# Patient Record
Sex: Female | Born: 1970 | Hispanic: Yes | Marital: Married | Smoking: Former smoker
Health system: Southern US, Community
[De-identification: ages and names within clinical notes are randomized; demographics above are authoritative.]

## PROBLEM LIST (undated history)

## (undated) DIAGNOSIS — F419 Anxiety disorder, unspecified: Secondary | ICD-10-CM

## (undated) DIAGNOSIS — E785 Hyperlipidemia, unspecified: Secondary | ICD-10-CM

## (undated) DIAGNOSIS — T7840XA Allergy, unspecified, initial encounter: Secondary | ICD-10-CM

## (undated) HISTORY — DX: Allergy, unspecified, initial encounter: T78.40XA

## (undated) HISTORY — PX: OVARIAN CYST SURGERY: SHX726

## (undated) HISTORY — DX: Anxiety disorder, unspecified: F41.9

## (undated) HISTORY — DX: Hyperlipidemia, unspecified: E78.5

---

## 2012-09-16 ENCOUNTER — Encounter (HOSPITAL_BASED_OUTPATIENT_CLINIC_OR_DEPARTMENT_OTHER): Payer: Self-pay | Admitting: *Deleted

## 2012-09-16 ENCOUNTER — Emergency Department (HOSPITAL_BASED_OUTPATIENT_CLINIC_OR_DEPARTMENT_OTHER)
Admission: EM | Admit: 2012-09-16 | Discharge: 2012-09-16 | Disposition: A | Discharge status: Home / Self Care | Payer: BC Managed Care – PPO | Attending: Emergency Medicine | Admitting: Emergency Medicine

## 2012-09-16 ENCOUNTER — Emergency Department (HOSPITAL_BASED_OUTPATIENT_CLINIC_OR_DEPARTMENT_OTHER): Payer: BC Managed Care – PPO

## 2012-09-16 DIAGNOSIS — Z791 Long term (current) use of non-steroidal anti-inflammatories (NSAID): Secondary | ICD-10-CM | POA: Insufficient documentation

## 2012-09-16 DIAGNOSIS — R079 Chest pain, unspecified: Secondary | ICD-10-CM | POA: Insufficient documentation

## 2012-09-16 DIAGNOSIS — R091 Pleurisy: Secondary | ICD-10-CM

## 2012-09-16 DIAGNOSIS — Z79899 Other long term (current) drug therapy: Secondary | ICD-10-CM | POA: Insufficient documentation

## 2012-09-16 DIAGNOSIS — R059 Cough, unspecified: Secondary | ICD-10-CM | POA: Insufficient documentation

## 2012-09-16 DIAGNOSIS — Z87891 Personal history of nicotine dependence: Secondary | ICD-10-CM | POA: Insufficient documentation

## 2012-09-16 DIAGNOSIS — R05 Cough: Secondary | ICD-10-CM | POA: Insufficient documentation

## 2012-09-16 MED ORDER — ONDANSETRON HCL 4 MG PO TABS: 4.0000 mg | ORAL_TABLET | Freq: Four times a day (QID) | ORAL | Status: DC

## 2012-09-16 MED ORDER — IBUPROFEN 800 MG PO TABS
ORAL_TABLET | ORAL | Status: AC
  Filled 2012-09-16: qty 1

## 2012-09-16 MED ORDER — IBUPROFEN 800 MG PO TABS: 800.0000 mg | ORAL_TABLET | Freq: Once | ORAL | Status: DC

## 2012-09-16 MED ORDER — HYDROCODONE-ACETAMINOPHEN 5-325 MG PO TABS
ORAL_TABLET | ORAL | Status: AC
  Filled 2012-09-16: qty 1

## 2012-09-16 MED ORDER — IBUPROFEN 800 MG PO TABS: 800.0000 mg | ORAL_TABLET | Freq: Three times a day (TID) | ORAL | Status: DC

## 2012-09-16 MED ORDER — IBUPROFEN 800 MG PO TABS
800.0000 mg | ORAL_TABLET | Freq: Once | ORAL | Status: AC
  Administered 2012-09-16: 800 mg via ORAL

## 2012-09-16 MED ORDER — HYDROCODONE-ACETAMINOPHEN 5-325 MG PO TABS: 1.0000 | ORAL_TABLET | ORAL | Status: DC | PRN

## 2012-09-16 MED ORDER — ONDANSETRON 4 MG PO TBDP
4.0000 mg | ORAL_TABLET | Freq: Once | ORAL | Status: AC
  Administered 2012-09-16: 4 mg via ORAL

## 2012-09-16 MED ORDER — ONDANSETRON 4 MG PO TBDP
ORAL_TABLET | ORAL | Status: AC
  Filled 2012-09-16: qty 1

## 2012-09-16 MED ORDER — HYDROCODONE-ACETAMINOPHEN 5-325 MG PO TABS
1.0000 | ORAL_TABLET | Freq: Once | ORAL | Status: AC
  Administered 2012-09-16: 1 via ORAL

## 2012-09-16 MED ORDER — HYDROCODONE-ACETAMINOPHEN 5-325 MG PO TABS: 1.0000 | ORAL_TABLET | Freq: Once | ORAL | Status: DC

## 2012-09-16 MED ORDER — ONDANSETRON 4 MG PO TBDP: 4.0000 mg | ORAL_TABLET | Freq: Once | ORAL | Status: DC

## 2012-09-16 NOTE — ED Notes (Signed)
Pain under left scapula x 2 days worse with deep breath coughing non productive had cold prior to the pain developing

## 2012-09-16 NOTE — ED Provider Notes (Signed)
CSN: [id number]     Arrival date & time 09/16/12  1478 History   First MD Initiated Contact with Patient 09/16/12 [phone]     Chief Complaint  Patient presents with  . pain under left scapula     HPI   Tara Alvarado is here with her husband. They live in the family has had what sounds like a viral type syndrome with cough runny nose. She had these symptoms until yesterday. She noticed 2 days ago that she had some sharp pain in the left posterior chest just adjacent her scapula. It hurts to take a deep breath and hurts to cough. She has a dry spontaneous but nonproductive nonbloody cough. She has not felt lightheaded or dizzy. No extremity pain or swelling. She is not pregnant. No prolonged immobilization cast splints fractures surgeries procedures or other DVT or PE risk. Does not take hormones. Family history negative for DVT or PE. No rash or vesicles across her chest History reviewed. No pertinent past medical history. History reviewed. No pertinent past surgical history. History reviewed. No pertinent family history. History  Substance Use Topics  . Smoking status: Former Games developer  . Smokeless tobacco: Not on file  . Alcohol Use: No   OB History   Grav Para Term Preterm Abortions TAB SAB Ect Mult Living                 Review of Systems  Constitutional: Negative for fever, chills, diaphoresis, appetite change and fatigue.  HENT: Negative for sore throat, mouth sores and trouble swallowing.   Eyes: Negative for visual disturbance.  Respiratory: Negative for cough, chest tightness, shortness of breath and wheezing.   Cardiovascular: Positive for chest pain.  Gastrointestinal: Negative for nausea, vomiting, abdominal pain, diarrhea and abdominal distention.  Endocrine: Negative for polydipsia, polyphagia and polyuria.  Genitourinary: Negative for dysuria, frequency and hematuria.  Musculoskeletal: Negative for gait problem.  Skin: Negative for color change, pallor and rash.  Neurological:  Negative for dizziness, syncope, light-headedness and headaches.  Hematological: Does not bruise/bleed easily.  Psychiatric/Behavioral: Negative for behavioral problems and confusion.    Allergies  Review of patient's allergies indicates no known allergies.  Home Medications   Current Outpatient Rx  Name  Route  Sig  Dispense  Refill  . HYDROcodone-acetaminophen (NORCO/VICODIN) 5-325 MG per tablet   Oral   Take 1 tablet by mouth every 4 (four) hours as needed for pain.   10 tablet   0   . ibuprofen (ADVIL,MOTRIN) 800 MG tablet   Oral   Take 1 tablet (800 mg total) by mouth 3 (three) times daily.   21 tablet   0   . ondansetron (ZOFRAN) 4 MG tablet   Oral   Take 1 tablet (4 mg total) by mouth every 6 (six) hours.   12 tablet   0    BP 137/92  Pulse 90  Temp(Src) 97.9 F (36.6 C) (Oral)  SpO2 100%  LMP 08/31/2012 Physical Exam  Pulmonary/Chest:  Normal appearing chest wall without vesicles. No skin sensitivity. No pleural or pericardial friction rubs. Her pain is no different in the upright, forward leaning, or left lateral decubitus position. She has no rubs in either of these physicians.    ED Course  Procedures (including critical care time) Labs Review Labs Reviewed - No data to display Imaging Review No results found.  MDM   1. Pleurisy    Is not hypoxemic, she's not tachycardic. She has no risks for DVT or PE. I  will obtain a plain chest x-ray to rule out pneumothorax and pneumonia. Working diagnosis is likely pleurisy secondary to recent viral infection.  Chest x-ray is normal. No pneumothorax. No infiltrates. Pain is improving. Slight nausea from the ibuprofen and Vicodin. Given by mouth Zofran. Plan rest. Avoid strenuous activity would cause her to breathe hard. Recheck with shortness of breath fevers or other symptoms. She is not tachycardic. She's not hypoxemic. No study is indicated other than the chest x-ray as above. Primary care  followup.    Claudean Kinds, MD 09/16/12 [phone]

## 2012-10-27 ENCOUNTER — Encounter (HOSPITAL_COMMUNITY): Payer: Self-pay | Admitting: Emergency Medicine

## 2012-10-27 ENCOUNTER — Emergency Department (HOSPITAL_COMMUNITY)
Admission: EM | Admit: 2012-10-27 | Discharge: 2012-10-28 | Disposition: A | Discharge status: Home / Self Care | Payer: BC Managed Care – PPO | Attending: Emergency Medicine | Admitting: Emergency Medicine

## 2012-10-27 ENCOUNTER — Emergency Department (HOSPITAL_COMMUNITY): Payer: BC Managed Care – PPO

## 2012-10-27 DIAGNOSIS — S99922A Unspecified injury of left foot, initial encounter: Secondary | ICD-10-CM

## 2012-10-27 DIAGNOSIS — Z79899 Other long term (current) drug therapy: Secondary | ICD-10-CM | POA: Insufficient documentation

## 2012-10-27 DIAGNOSIS — S8990XA Unspecified injury of unspecified lower leg, initial encounter: Secondary | ICD-10-CM | POA: Insufficient documentation

## 2012-10-27 DIAGNOSIS — Y92009 Unspecified place in unspecified non-institutional (private) residence as the place of occurrence of the external cause: Secondary | ICD-10-CM | POA: Insufficient documentation

## 2012-10-27 DIAGNOSIS — IMO0002 Reserved for concepts with insufficient information to code with codable children: Secondary | ICD-10-CM | POA: Insufficient documentation

## 2012-10-27 DIAGNOSIS — Y9301 Activity, walking, marching and hiking: Secondary | ICD-10-CM | POA: Insufficient documentation

## 2012-10-27 DIAGNOSIS — Z87891 Personal history of nicotine dependence: Secondary | ICD-10-CM | POA: Insufficient documentation

## 2012-10-27 MED ORDER — HYDROCODONE-ACETAMINOPHEN 5-325 MG PO TABS
1.0000 | ORAL_TABLET | Freq: Once | ORAL | Status: AC
  Administered 2012-10-27: 1 via ORAL
  Filled 2012-10-27: qty 1

## 2012-10-27 MED ORDER — HYDROCODONE-ACETAMINOPHEN 5-325 MG PO TABS: 1.0000 | ORAL_TABLET | ORAL | Status: DC | PRN

## 2012-10-27 NOTE — ED Provider Notes (Signed)
CSN: [id number]     Arrival date & time 10/27/12  2253 History   First MD Initiated Contact with Patient 10/27/12 2314     Chief Complaint  Patient presents with  . Foot Injury   (Consider location/radiation/quality/duration/timing/severity/associated sxs/prior Treatment) HPI Comments: Patient here after running into a bench while walking barefooted at home and striking her left 5th toe.  She reports pain and increased swelling, no prior injury to the area.  Is able to ambulate without difficulty.  Patient is a 42 y.o. female presenting with foot injury. The history is provided by the patient. No language interpreter was used.  Foot Injury Location:  Toe Time since incident:  2 hours Injury: yes   Toe location:  L little toe Pain details:    Quality:  Shooting and sharp   Radiates to:  Does not radiate   Severity:  Moderate   Onset quality:  Sudden   Timing:  Constant   Progression:  Worsening Chronicity:  New Dislocation: no   Foreign body present:  No foreign bodies Prior injury to area:  No Relieved by:  Nothing Worsened by:  Nothing tried Ineffective treatments:  None tried Associated symptoms: swelling   Associated symptoms: no back pain, no fatigue, no fever, no itching, no muscle weakness, no neck pain, no numbness, no stiffness and no tingling     History reviewed. No pertinent past medical history. Past Surgical History  Procedure Laterality Date  . Cesarean section     Family History  Problem Relation Age of Onset  . Cancer Other   . CAD Other    History  Substance Use Topics  . Smoking status: Former Games developer  . Smokeless tobacco: Not on file  . Alcohol Use: No   OB History   Grav Para Term Preterm Abortions TAB SAB Ect Mult Living                 Review of Systems  Constitutional: Negative for fever and fatigue.  Musculoskeletal: Negative for back pain, neck pain and stiffness.  Skin: Negative for itching.  All other systems reviewed and are  negative.    Allergies  Review of patient's allergies indicates no known allergies.  Home Medications   Current Outpatient Rx  Name  Route  Sig  Dispense  Refill  . naproxen sodium (ANAPROX) 275 MG tablet   Oral   Take 275 mg by mouth 2 (two) times daily with a meal.          BP 147/87  Pulse 91  Temp(Src) 98.8 F (37.1 C) (Oral)  Resp 14  SpO2 100%  LMP 10/01/2012 Physical Exam  Nursing note and vitals reviewed. Constitutional: She is oriented to person, place, and time. She appears well-developed and well-nourished. No distress.  HENT:  Head: Normocephalic and atraumatic.  Nose: Nose normal.  Mouth/Throat: Oropharynx is clear and moist.  Eyes: Conjunctivae are normal. No scleral icterus.  Musculoskeletal:       Left foot: She exhibits tenderness, bony tenderness and swelling. She exhibits normal range of motion, normal capillary refill and no deformity.       Feet:  Neurological: She is alert and oriented to person, place, and time. She exhibits normal muscle tone. Coordination normal.  Skin: Skin is warm and dry. No rash noted. No erythema. No pallor.  Psychiatric: She has a normal mood and affect. Her behavior is normal. Judgment and thought content normal.    ED Course  Procedures (including critical care time)  Labs Review Labs Reviewed - No data to display Imaging Review Dg Toe 5th Left  10/27/2012   CLINICAL DATA:  Pain after foot injury  EXAM: DG TOE 5TH LEFT  COMPARISON:  None.  FINDINGS: There is widening and lateral subluxation of the PIP joint of the little digit. No fracture seen.  IMPRESSION: Fifth PIP joint injury with widening and lateral subluxation.   Electronically Signed   By: Tiburcio Pea M.D.   On: 10/27/2012 23:27    EKG Interpretation   None       MDM  Toe injury  Patient with slight lateral dislocation, able to relocate without difficulty and then toes buddy taped - placed in post op shoe - will RICE and keep buddy  taped.   Izola Price Marisue Humble, PA-C 10/27/12 2355

## 2012-10-27 NOTE — ED Notes (Signed)
Pt states she hurt her little toe on her left foot tonight  Toe is red and swollen

## 2012-10-28 NOTE — ED Provider Notes (Signed)
Medical screening examination/treatment/procedure(s) were performed by non-physician practitioner and as supervising physician I was immediately available for consultation/collaboration.  EKG Interpretation   None         Lyanne Co, MD 10/28/12 [phone]

## 2013-04-25 ENCOUNTER — Ambulatory Visit (INDEPENDENT_AMBULATORY_CARE_PROVIDER_SITE_OTHER): Payer: BC Managed Care – PPO | Admitting: Family Medicine

## 2013-04-25 ENCOUNTER — Ambulatory Visit: Payer: BC Managed Care – PPO

## 2013-04-25 VITALS — BP 132/80 | HR 83 | Temp 98.2°F | Resp 18 | Ht <= 58 in | Wt 136.0 lb

## 2013-04-25 DIAGNOSIS — J9801 Acute bronchospasm: Secondary | ICD-10-CM

## 2013-04-25 DIAGNOSIS — R079 Chest pain, unspecified: Secondary | ICD-10-CM

## 2013-04-25 DIAGNOSIS — R05 Cough: Secondary | ICD-10-CM

## 2013-04-25 DIAGNOSIS — J301 Allergic rhinitis due to pollen: Secondary | ICD-10-CM

## 2013-04-25 DIAGNOSIS — R0602 Shortness of breath: Secondary | ICD-10-CM

## 2013-04-25 DIAGNOSIS — Z1239 Encounter for other screening for malignant neoplasm of breast: Secondary | ICD-10-CM

## 2013-04-25 DIAGNOSIS — K219 Gastro-esophageal reflux disease without esophagitis: Secondary | ICD-10-CM

## 2013-04-25 DIAGNOSIS — R059 Cough, unspecified: Secondary | ICD-10-CM

## 2013-04-25 LAB — POCT CBC
Granulocyte percent: 67.9 %G (ref 37–80)
HEMATOCRIT: 43.1 % (ref 37.7–47.9)
HEMOGLOBIN: 13.6 g/dL (ref 12.2–16.2)
LYMPH, POC: 1.1 (ref 0.6–3.4)
MCH, POC: 28.5 pg (ref 27–31.2)
MCHC: 31.6 g/dL — AB (ref 31.8–35.4)
MCV: 90.2 fL (ref 80–97)
MID (cbc): 0.4 (ref 0–0.9)
MPV: 10.3 fL (ref 0–99.8)
POC GRANULOCYTE: 3.1 (ref 2–6.9)
POC LYMPH PERCENT: 23.4 %L (ref 10–50)
POC MID %: 8.7 %M (ref 0–12)
Platelet Count, POC: 268 10*3/uL (ref 142–424)
RBC: 4.78 M/uL (ref 4.04–5.48)
RDW, POC: 13.6 %
WBC: 4.5 10*3/uL — AB (ref 4.6–10.2)

## 2013-04-25 LAB — COMPREHENSIVE METABOLIC PANEL
ALBUMIN: 4.6 g/dL (ref 3.5–5.2)
ALT: 18 U/L (ref 0–35)
AST: 18 U/L (ref 0–37)
Alkaline Phosphatase: 87 U/L (ref 39–117)
BILIRUBIN TOTAL: 0.5 mg/dL (ref 0.2–1.2)
BUN: 10 mg/dL (ref 6–23)
CO2: 27 meq/L (ref 19–32)
Calcium: 9.8 mg/dL (ref 8.4–10.5)
Chloride: 100 mEq/L (ref 96–112)
Creat: 0.77 mg/dL (ref 0.50–1.10)
GLUCOSE: 90 mg/dL (ref 70–99)
Potassium: 4.6 mEq/L (ref 3.5–5.3)
SODIUM: 135 meq/L (ref 135–145)
TOTAL PROTEIN: 7.6 g/dL (ref 6.0–8.3)

## 2013-04-25 MED ORDER — LORATADINE 10 MG PO TABS: 10.0000 mg | ORAL_TABLET | Freq: Every day | ORAL | Status: DC

## 2013-04-25 MED ORDER — PREDNISONE 20 MG PO TABS: ORAL_TABLET | ORAL | Status: DC

## 2013-04-25 MED ORDER — RANITIDINE HCL 150 MG PO TABS: 150.0000 mg | ORAL_TABLET | Freq: Two times a day (BID) | ORAL | Status: DC

## 2013-04-25 NOTE — Patient Instructions (Signed)
1. Call Dr. Katrinka BlazingSmith in two weeks if cough and chest pain and shortness of breath are not much better.     Allergic Rhinitis Allergic rhinitis is when the mucous membranes in the nose respond to allergens. Allergens are particles in the air that cause your body to have an allergic reaction. This causes you to release allergic antibodies. Through a chain of events, these eventually cause you to release histamine into the blood stream. Although meant to protect the body, it is this release of histamine that causes your discomfort, such as frequent sneezing, congestion, and an itchy, runny nose.  CAUSES  Seasonal allergic rhinitis (hay fever) is caused by pollen allergens that may come from grasses, trees, and weeds. Year-round allergic rhinitis (perennial allergic rhinitis) is caused by allergens such as house dust mites, pet dander, and mold spores.  SYMPTOMS   Nasal stuffiness (congestion).  Itchy, runny nose with sneezing and tearing of the eyes. DIAGNOSIS  Your health care provider can help you determine the allergen or allergens that trigger your symptoms. If you and your health care provider are unable to determine the allergen, skin or blood testing may be used. TREATMENT  Allergic Rhinitis does not have a cure, but it can be controlled by:  Medicines and allergy shots (immunotherapy).  Avoiding the allergen. Hay fever may often be treated with antihistamines in pill or nasal spray forms. Antihistamines block the effects of histamine. There are over-the-counter medicines that may help with nasal congestion and swelling around the eyes. Check with your health care provider before taking or giving this medicine.  If avoiding the allergen or the medicine prescribed do not work, there are many new medicines your health care provider can prescribe. Stronger medicine may be used if initial measures are ineffective. Desensitizing injections can be used if medicine and avoidance does not work.  Desensitization is when a patient is given ongoing shots until the body becomes less sensitive to the allergen. Make sure you follow up with your health care provider if problems continue. HOME CARE INSTRUCTIONS It is not possible to completely avoid allergens, but you can reduce your symptoms by taking steps to limit your exposure to them. It helps to know exactly what you are allergic to so that you can avoid your specific triggers. SEEK MEDICAL CARE IF:   You have a fever.  You develop a cough that does not stop easily (persistent).  You have shortness of breath.  You start wheezing.  Symptoms interfere with normal daily activities. Document Released: 09/12/2000 Document Revised: 10/08/2012 Document Reviewed: 08/25/2012 [facility] Patient Information 2014 [employer] [place].

## 2013-04-25 NOTE — Progress Notes (Signed)
This chart was scribed for [name], MD by Luisa DagoPriscilla Tutu, ED Scribe. This patient was seen in room 3 and the patient's care was started at 10:33 AM. Subjective:    Patient ID: Tara Alvarado, female    DOB: [birth year], 43 y.o.   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 She is also complaining of a dry cough. Pt states [facility] with her symptoms. A chest X-Ray was obtained with no abnormal findings. She was diagnosed with pleurisy.  She was given some pain medication and nausea medication (Hydrocodone and Zofran). Pt states that the pain has not subsided and she is still experiencing pain. She describes her pain as "a tightening sensation".  Pt denies a history of asthma, but she was a daily smoker for a couple of years but has since then quit. She states that she also experiences SOB especially with exertion. Pt is also experiencing left sided pain and seasonal allergy. She states that she has only had 3 flu vaccines in her lifetime, and every time she gets them she gets really sick. Denies any sore throat, ear pain, and abdominal pain.  Chest pain and SOB also worsens at nighttime.  Denies recent travel or prolonged immobilization.  No hormonal therapy.  No leg swelling or calf pain.  No frequent indigestion or heartburn however can only tolerate small amounts of NSAIDs due to stomach upset. Did take Sudafed for a while with improvement in cough.  Not currently taking anything for allergies. No history of asthma.  She works in housekeeping.  She continues to have normal menses.  Pt's mother is still living and is currently 45 years old. She is suffering from cardiovascular disease. Pt reports that mother had a MI at the age of 45. Father is also living  but suffering from heart problems. His first MI was at the age of 45.   LNMP: 04/06/2013  Review of Systems  Constitutional: Negative for fever, chills and diaphoresis.  HENT: Positive for congestion. Negative for ear pain, postnasal drip, rhinorrhea, sneezing, sore throat, trouble swallowing and voice change.   Respiratory: Positive for cough, shortness of breath and wheezing. Negative for stridor.   Cardiovascular: Positive for chest pain. Negative for palpitations and leg swelling.  Gastrointestinal: Negative for nausea, vomiting, abdominal pain, diarrhea and abdominal distention.  Skin: Negative for rash.    Past Medical History  Diagnosis Date  . Anxiety   . Allergy    No Known Allergies Current Outpatient Prescriptions  Medication Sig Dispense Refill  . HYDROcodone-acetaminophen (NORCO/VICODIN) 5-325 MG per tablet Take 1 tablet by mouth every 4 (four) hours as needed for pain.  15 tablet  0  . loratadine (CLARITIN) 10 MG tablet Take 1 tablet (10 mg total) by mouth daily.  30 tablet  11  . naproxen sodium (ANAPROX) 275 MG tablet Take 275 mg by mouth 2 (two) times daily with a meal.      . predniSONE (DELTASONE) 20 MG tablet Three tablets daily x 1 day, then two tablets daily x 5 days, then one tablet daily x 5 days  18 tablet  0  . ranitidine (ZANTAC) 150 MG tablet Take 1 tablet (150 mg total) by mouth 2 (two) times daily.  60 tablet  5   No current facility-administered medications for this visit.   History   Social History  . Marital Status: Married    Spouse Name: N/A    Number of Children: N/A  . Years of Education: N/A   Occupational History  . Not on file.   Social History Main Topics  . Smoking status: Former Games developermoker  . Smokeless tobacco: Not on file  . Alcohol Use: No  . Drug Use: No  . Sexual Activity: No   Other Topics Concern  . Not on file   Social History Narrative  . No narrative on file   Family History  Problem Relation Age of Onset  . Cancer  Other   . CAD Other   . Heart disease Mother 1655    AMI/CAD  . Heart disease Father 4260    AMI x 2  . Hyperlipidemia Father        Objective:    BP 132/80  Pulse 83  Temp(Src) 98.2 F (36.8 C) (Oral)  Resp 18  Ht 4\' 9"  (1.448 m)  Wt 136 lb (61.689 kg)  BMI 29.42 kg/m2  SpO2 99%  LMP 04/06/2013  Physical Exam  Nursing note and vitals reviewed. Constitutional: She appears well-developed and well-nourished. No distress.  HENT:  Head: Normocephalic and atraumatic.  Right Ear: External ear normal.  Left Ear: External ear normal.  Nose: Nose normal.  Mouth/Throat: Oropharynx is clear and moist. No oropharyngeal exudate.  Eyes: Conjunctivae and EOM are normal. Pupils are equal, round, and reactive to light. Right eye exhibits no discharge. Left eye exhibits no discharge.  Neck: Neck supple. No JVD present. No thyromegaly present.  Cardiovascular: Normal rate, regular rhythm, normal heart sounds and intact distal pulses.  Exam reveals no gallop and no friction rub.   No murmur heard. Pulmonary/Chest: Effort normal and breath sounds normal. No stridor. No respiratory distress. She has no wheezes. She has no rales. She exhibits tenderness.  No wheezing with forced expiration.  +TTP B anterior chest wall.  Abdominal: Soft. Bowel sounds are normal. She exhibits no distension and no mass. There is no tenderness. There is no rebound and no guarding.  Musculoskeletal: She exhibits no tenderness.  No lower extremity edema. Negative Homans bilaterally.  Lymphadenopathy:    She has no cervical adenopathy.  Neurological: She is alert.  Skin: Skin is warm and dry. She is not diaphoretic.  Psychiatric: She has a normal mood and affect. Her behavior is normal. Thought content normal.   Results for orders placed in visit on 04/25/13  POCT CBC      Result Value Ref Range   WBC 4.5 (*) 4.6 - 10.2 K/uL   Lymph, poc 1.1  0.6 - 3.4   POC LYMPH PERCENT 23.4  10 - 50 %L   MID (cbc) 0.4  0 - 0.9     POC MID % 8.7  0 - 12 %M   POC Granulocyte 3.1  2 - 6.9   Granulocyte percent 67.9  37 - 80 %G   RBC 4.78  4.04 - 5.48 M/uL   Hemoglobin 13.6  12.2 - 16.2 g/dL   HCT, POC 40.943.1  81.137.7 - 47.9 %   MCV 90.2  80 - 97 fL   MCH, POC 28.5  27 - 31.2 pg   MCHC 31.6 (*) 31.8 - 35.4 g/dL   RDW, POC 91.413.6     Platelet Count, POC 268  142 - 424 K/uL   MPV  10.3  0 - 99.8 fL   EKG: NSR; no ST changes.  [facility] reading (PRIMARY) by  Dr. Katrinka Blazing.  CXR:  NAD      Assessment & Plan:   1. Chest pain   2. SOB (shortness of breath)   3. Cough   4. Allergic rhinitis due to pollen   5. Esophageal reflux   6. Bronchospasm   7. Breast cancer screening    1. Chest pain:  New.  Atypical in presentation; some exertional component but associated with cough, SOB.  Treat for allergic rhinitis induced bronchospasm; if persists, refer for stress testing. If no improvement in two weeks, call office. 2.  SOB:  New. Associated with cough, chest pain.  CXR negative.  Low risk for pulmonary embolism.  Atypical for angina. Treat for allergic rhinitis induced bronchospasm. If persists, refer to cardiology for echo and stress testing.  If persists, also obtain PFT. 3.  Cough:  New.  Associated with chest pain, SOB.  CXR negative.  Treat for allergic rhinitis and GERD.  If persists, consider treating for atypical with Doxy or Zpack. 4.  Allergic Rhinitis:  New.  Uncontrolled. Rx for Prednisone and Claritin. Pt declined nasal steroid. 5.  GERD:  New.  Uncontrolled; rx for Ranitidine provided to use bid. 6. Breast cancer screening: refer for mammogram; pt to return for CPE.  Meds ordered this encounter  Medications  . predniSONE (DELTASONE) 20 MG tablet    Sig: Three tablets daily x 1 day, then two tablets daily x 5 days, then one tablet daily x 5 days    Dispense:  18 tablet    Refill:  0  . loratadine (CLARITIN) 10 MG tablet    Sig: Take 1 tablet (10 mg total) by mouth daily.    Dispense:  30 tablet    Refill:  11  .  ranitidine (ZANTAC) 150 MG tablet    Sig: Take 1 tablet (150 mg total) by mouth 2 (two) times daily.    Dispense:  60 tablet    Refill:  5    Return if symptoms worsen or fail to improve.  I personally performed the services described in this documentation, which was scribed in my presence. The recorded information has been reviewed and is accurate.   Nilda Simmer, M.D.  [facility] [address] [place], [place]  [address] [phone] phone [phone] fax

## 2013-04-30 ENCOUNTER — Encounter: Payer: Self-pay | Admitting: Family Medicine

## 2013-08-25 ENCOUNTER — Telehealth: Payer: Self-pay

## 2013-08-25 DIAGNOSIS — Z1239 Encounter for other screening for malignant neoplasm of breast: Secondary | ICD-10-CM

## 2013-08-25 NOTE — Telephone Encounter (Signed)
Re entered MM referral from April OV- order was placed but not scheduled.

## 2013-08-25 NOTE — Telephone Encounter (Signed)
Pt is needing a mammogram referral from dr Katrinka Blazing

## 2013-08-26 ENCOUNTER — Other Ambulatory Visit: Payer: Self-pay

## 2013-08-27 ENCOUNTER — Telehealth: Payer: Self-pay

## 2013-08-27 NOTE — Telephone Encounter (Signed)
Patient LMOM on referral desk but only left her name to call back. I wanted to let her know if she calls back that [facility] should be calling her to set up an appointment and that the orders were faxed yesterday for her screening.  [facility] phone: [phone]

## 2013-08-28 ENCOUNTER — Ambulatory Visit
Admission: RE | Admit: 2013-08-28 | Discharge: 2013-08-28 | Disposition: A | Discharge status: Home / Self Care | Payer: BC Managed Care – PPO | Source: Ambulatory Visit | Attending: Family Medicine | Admitting: Family Medicine

## 2013-08-28 DIAGNOSIS — Z1239 Encounter for other screening for malignant neoplasm of breast: Secondary | ICD-10-CM

## 2013-09-25 ENCOUNTER — Ambulatory Visit (INDEPENDENT_AMBULATORY_CARE_PROVIDER_SITE_OTHER): Payer: BC Managed Care – PPO | Admitting: Family Medicine

## 2013-09-25 ENCOUNTER — Ambulatory Visit (INDEPENDENT_AMBULATORY_CARE_PROVIDER_SITE_OTHER): Payer: BC Managed Care – PPO

## 2013-09-25 VITALS — BP 132/78 | HR 85 | Temp 98.0°F | Resp 16 | Ht <= 58 in | Wt 130.0 lb

## 2013-09-25 DIAGNOSIS — Z1329 Encounter for screening for other suspected endocrine disorder: Secondary | ICD-10-CM

## 2013-09-25 DIAGNOSIS — Z130 Encounter for screening for diseases of the blood and blood-forming organs and certain disorders involving the immune mechanism: Secondary | ICD-10-CM

## 2013-09-25 DIAGNOSIS — Z124 Encounter for screening for malignant neoplasm of cervix: Secondary | ICD-10-CM

## 2013-09-25 DIAGNOSIS — Z131 Encounter for screening for diabetes mellitus: Secondary | ICD-10-CM

## 2013-09-25 DIAGNOSIS — Z0000 Encounter for general adult medical examination without abnormal findings: Secondary | ICD-10-CM

## 2013-09-25 DIAGNOSIS — Z13220 Encounter for screening for lipoid disorders: Secondary | ICD-10-CM

## 2013-09-25 LAB — LIPID PANEL
CHOLESTEROL: 242 mg/dL — AB (ref 0–200)
HDL: 70 mg/dL (ref >39)
LDL CALC: 153 mg/dL — AB (ref 0–99)
TRIGLYCERIDES: 97 mg/dL (ref <150)
Total CHOL/HDL Ratio: 3.5 Ratio
VLDL: 19 mg/dL (ref 0–40)

## 2013-09-25 LAB — CBC
HCT: 40.4 % (ref 36.0–46.0)
Hemoglobin: 13.9 g/dL (ref 12.0–15.0)
MCH: 28.6 pg (ref 26.0–34.0)
MCHC: 34.4 g/dL (ref 30.0–36.0)
MCV: 83.1 fL (ref 78.0–100.0)
PLATELETS: 270 10*3/uL (ref 150–400)
RBC: 4.86 MIL/uL (ref 3.87–5.11)
RDW: 13.6 % (ref 11.5–15.5)
WBC: 5.9 10*3/uL (ref 4.0–10.5)

## 2013-09-25 LAB — COMPREHENSIVE METABOLIC PANEL
ALBUMIN: 4.6 g/dL (ref 3.5–5.2)
ALK PHOS: 87 U/L (ref 39–117)
ALT: 16 U/L (ref 0–35)
AST: 18 U/L (ref 0–37)
BUN: 12 mg/dL (ref 6–23)
CALCIUM: 9.9 mg/dL (ref 8.4–10.5)
CHLORIDE: 101 meq/L (ref 96–112)
CO2: 30 meq/L (ref 19–32)
Creat: 0.79 mg/dL (ref 0.50–1.10)
GLUCOSE: 102 mg/dL — AB (ref 70–99)
POTASSIUM: 4 meq/L (ref 3.5–5.3)
SODIUM: 139 meq/L (ref 135–145)
TOTAL PROTEIN: 7.3 g/dL (ref 6.0–8.3)
Total Bilirubin: 0.7 mg/dL (ref 0.2–1.2)

## 2013-09-25 LAB — TSH: TSH: 0.733 u[IU]/mL (ref 0.350–4.500)

## 2013-09-25 NOTE — Progress Notes (Signed)
[facility] [address] [place] [address] [phone]  Date:  09/25/2013   Name:  Janitza Revuelta   DOB:  1970-11-17   MRN:  [id number]  PCP:  No PCP Per Patient    Chief Complaint: Annual Exam   History of Present Illness:  Leimomi Zervas is a 43 y.o. very pleasant female patient who presents with the following:  Here today for a PE and pap. Her last pap was about 4 years ago- never had an abnormal.   She is not sure of the date of her last tetanus but states it was less than 10 years ago.  She declines a flu shot today.   She is [place] just with her husband, married with 3 children ranging from 67- 26.  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   Past Medical History  Diagnosis Date  . Anxiety   . Allergy     Past Surgical History  Procedure Laterality Date  . Cesarean section      History  Substance Use Topics  . Smoking status: Former Games developer  . Smokeless tobacco: Not on file  . Alcohol Use: No    Family History  Problem Relation Age of Onset  . Cancer Other   . CAD Other   . Heart disease Mother 41    AMI/CAD  . Heart disease Father 73    AMI x 2  . Hyperlipidemia Father     No Known Allergies  Medication list has been reviewed and updated.  Current Outpatient Prescriptions on File Prior to Visit  Medication Sig Dispense Refill  . HYDROcodone-acetaminophen (NORCO/VICODIN) 5-325 MG per tablet Take 1 tablet by mouth every 4 (four) hours as needed for pain.  15 tablet  0  . loratadine (CLARITIN) 10 MG tablet Take 1 tablet (10 mg total) by mouth daily.  30 tablet  11  . naproxen sodium (ANAPROX) 275 MG tablet Take 275 mg by mouth 2 (two) times daily with a meal.      . predniSONE (DELTASONE) 20 MG tablet Three tablets daily x 1 day, then two tablets daily x 5 days, then one tablet daily x 5 days  18  tablet  0  . ranitidine (ZANTAC) 150 MG tablet Take 1 tablet (150 mg total) by mouth 2 (two) times daily.  60 tablet  5   No current facility-administered medications on file prior to visit.    Review of Systems:  As per HPI- otherwise negative.   Physical Examination: Filed Vitals:   09/25/13 1210  BP: 132/78  Pulse: 85  Temp: 98 F (36.7 C)  Resp: 16   Filed Vitals:   09/25/13 1210  Height:  (1.473 m)  Weight: 130 lb (58.968 kg)   Body mass index is 27.18 kg/(m^2). Ideal Body Weight: Weight in (lb) to have BMI = 25: 119.4  GEN: WDWN, NAD, Non-toxic, A & O x 3, mild overweight, looks well HEENT: Atraumatic, Normocephalic. Neck supple. No masses, No LAD.  Bilateral TM wnl, oropharynx normal.  PEERL,EOMI.   Ears and Nose: No external deformity. CV: RRR, No M/G/R. No JVD. No thrill. No extra heart sounds. PULM: CTA B, no wheezes, crackles, rhonchi. No retractions. No resp. distress. No accessory muscle use. ABD: S, NT, ND, +BS. No rebound. No HSM. EXTR: No c/c/e NEURO  Normal gait.  PSYCH: Normally interactive. Conversant. Not depressed or anxious appearing.  Calm demeanor.  Recent normal mammogram- did not perform breast exam today Pelvic: normal, no vaginal lesions or discharge. Uterus normal, no CMT, no adnexal tendereness or masses    Assessment and Plan: Physical exam  Screening for cervical cancer - Plan: Pap IG and HPV (high risk) DNA detection  Screening for deficiency anemia - Plan: CBC  Screening for hyperlipidemia - Plan: Lipid panel  Screening for diabetes mellitus - Plan: Comprehensive metabolic panel  Screening for hypothyroidism - Plan: TSH  Follow-up pending labs as above  Signed Abbe Amsterdam, MD

## 2013-09-25 NOTE — Patient Instructions (Signed)
Good to see you today- take care and I will be in touch with the rest of your labs asap.

## 2013-09-29 LAB — PAP IG AND HPV HIGH-RISK: HPV DNA HIGH RISK: NOT DETECTED

## 2014-10-17 IMAGING — CR DG TOE 5TH 2+V*L*
3 series · 3 of 3 positions shown · non-contrast
Comparison: None.

CLINICAL DATA: Pain after foot injury

EXAM:
DG TOE 5TH LEFT

[x toes ap left]
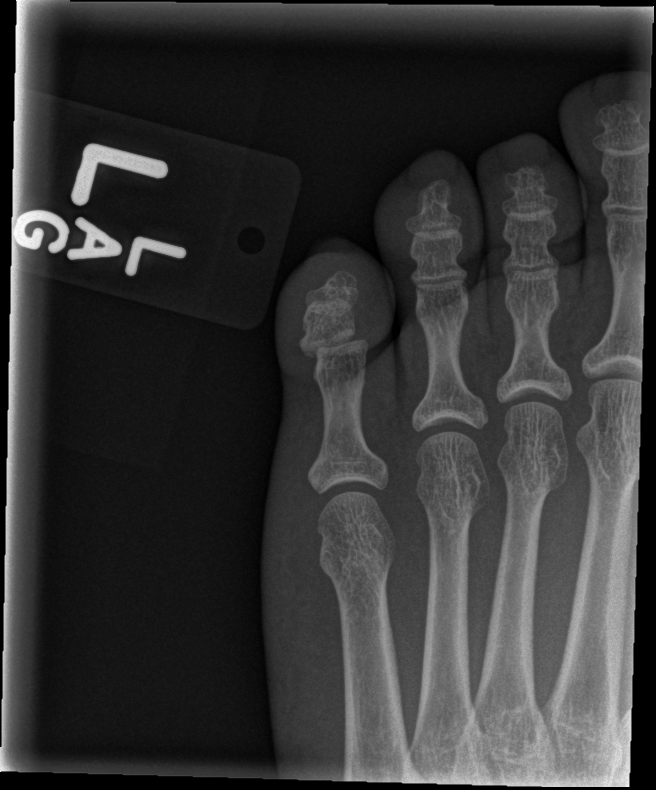

[x toes obl left]
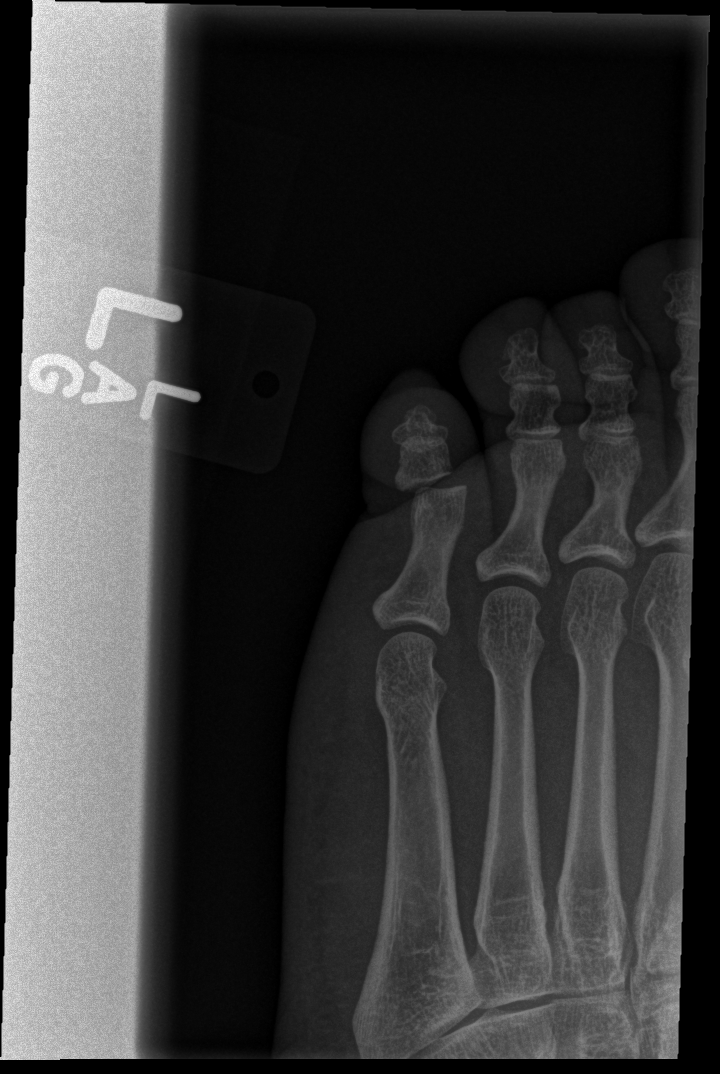

[x toes lat left]
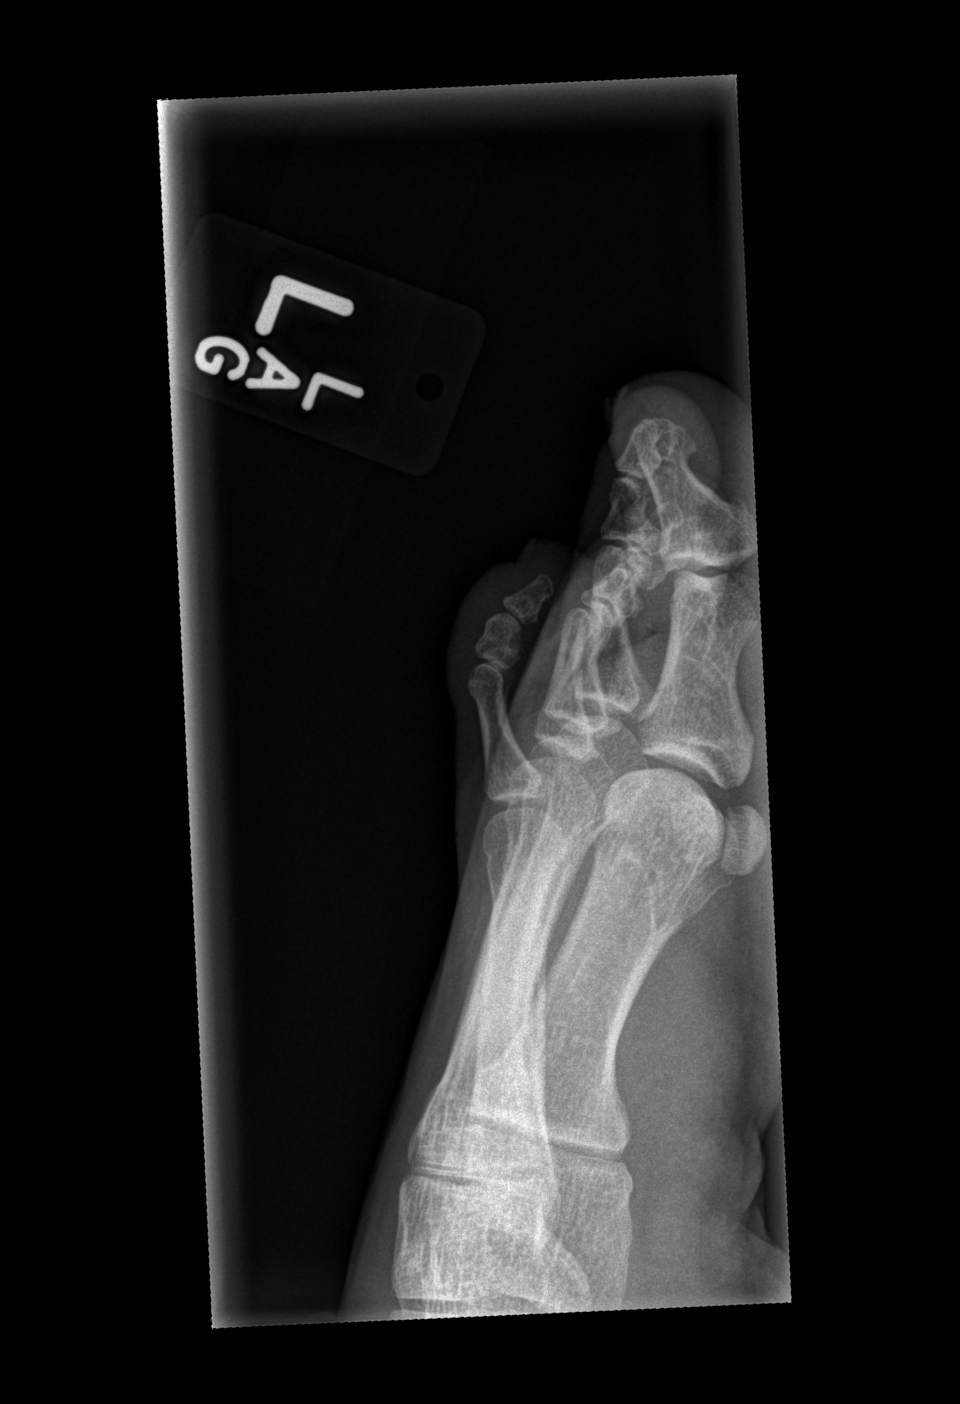

[3 of 3 positions shown; findings below may reference images not displayed]

FINDINGS: There is widening and lateral subluxation of the PIP joint of the
little digit. No fracture seen.
IMPRESSION: Fifth PIP joint injury with widening and lateral subluxation.

## 2014-11-16 ENCOUNTER — Other Ambulatory Visit: Payer: Self-pay

## 2014-11-16 DIAGNOSIS — Z1231 Encounter for screening mammogram for malignant neoplasm of breast: Secondary | ICD-10-CM

## 2014-12-17 ENCOUNTER — Ambulatory Visit
Admission: RE | Admit: 2014-12-17 | Discharge: 2014-12-17 | Disposition: A | Discharge status: Home / Self Care | Payer: BLUE CROSS/BLUE SHIELD | Source: Ambulatory Visit

## 2014-12-17 DIAGNOSIS — Z1231 Encounter for screening mammogram for malignant neoplasm of breast: Secondary | ICD-10-CM

## 2017-01-08 ENCOUNTER — Ambulatory Visit (INDEPENDENT_AMBULATORY_CARE_PROVIDER_SITE_OTHER): Payer: Managed Care, Other (non HMO)

## 2017-01-08 ENCOUNTER — Encounter: Payer: Self-pay | Admitting: Emergency Medicine

## 2017-01-08 ENCOUNTER — Ambulatory Visit (INDEPENDENT_AMBULATORY_CARE_PROVIDER_SITE_OTHER): Payer: Managed Care, Other (non HMO) | Admitting: Emergency Medicine

## 2017-01-08 ENCOUNTER — Other Ambulatory Visit: Payer: Self-pay

## 2017-01-08 VITALS — BP 138/86 | HR 83 | Temp 98.3°F | Resp 16 | Ht <= 58 in | Wt 136.6 lb

## 2017-01-08 DIAGNOSIS — Z0000 Encounter for general adult medical examination without abnormal findings: Secondary | ICD-10-CM

## 2017-01-08 NOTE — Patient Instructions (Addendum)
   IF you received an x-ray today, you will receive an invoice from [facility]. Please contact [facility] at [phone] with questions or concerns regarding your invoice.   IF you received labwork today, you will receive an invoice from [facility]. Please contact [facility] at [phone] with questions or concerns regarding your invoice.   Our billing staff will not be able to assist you with questions regarding bills from these companies.  You will be contacted with the lab results as soon as they are available. The fastest way to get your results is to activate your My Chart account. Instructions are located on the last page of this paperwork. If you have not heard from us regarding the results in 2 weeks, please contact this office.     Health Maintenance, Female Adopting a healthy lifestyle and getting preventive care can go a long way to promote health and wellness. Talk with your health care provider about what schedule of regular examinations is right for you. This is a good chance for you to check in with your provider about disease prevention and staying healthy. In between checkups, there are plenty of things you can do on your own. Experts have done a lot of research about which lifestyle changes and preventive measures are most likely to keep you healthy. Ask your health care provider for more information. Weight and diet Eat a healthy diet  Be sure to include plenty of vegetables, fruits, low-fat dairy products, and lean protein.  Do not eat a lot of foods high in solid fats, added sugars, or salt.  Get regular exercise. This is one of the most important things you can do for your health. ? Most adults should exercise for at least 150 minutes each week. The exercise should increase your heart rate and make you sweat (moderate-intensity exercise). ? Most adults should also do strengthening exercises at least twice a week. This is in addition to the  moderate-intensity exercise.  Maintain a healthy weight  Body mass index (BMI) is a measurement that can be used to identify possible weight problems. It estimates body fat based on height and weight. Your health care provider can help determine your BMI and help you achieve or maintain a healthy weight.  For females 20 years of age and older: ? A BMI below 18.5 is considered underweight. ? A BMI of 18.5 to 24.9 is normal. ? A BMI of 25 to 29.9 is considered overweight. ? A BMI of 30 and above is considered obese.  Watch levels of cholesterol and blood lipids  You should start having your blood tested for lipids and cholesterol at 49 years of age, then have this test every 5 years.  You may need to have your cholesterol levels checked more often if: ? Your lipid or cholesterol levels are high. ? You are older than 50 years of age. ? You are at high risk for heart disease.  Cancer screening Lung Cancer  Lung cancer screening is recommended for adults 55-80 years old who are at high risk for lung cancer because of a history of smoking.  A yearly low-dose CT scan of the lungs is recommended for people who: ? Currently smoke. ? Have quit within the past 15 years. ? Have at least a 30-pack-year history of smoking. A pack year is smoking an average of one pack of cigarettes a day for 1 year.  Yearly screening should continue until it has been 15 years since you quit.  Yearly   screening should stop if you develop a health problem that would prevent you from having lung cancer treatment.  Breast Cancer  Practice breast self-awareness. This means understanding how your breasts normally appear and feel.  It also means doing regular breast self-exams. Let your health care provider know about any changes, no matter how small.  If you are in your 20s or 30s, you should have a clinical breast exam (CBE) by a health care provider every 1-3 years as part of a regular health exam.  If you  are 73 or older, have a CBE every year. Also consider having a breast X-ray (mammogram) every year.  If you have a family history of breast cancer, talk to your health care provider about genetic screening.  If you are at high risk for breast cancer, talk to your health care provider about having an MRI and a mammogram every year.  Breast cancer gene (BRCA) assessment is recommended for women who have family members with BRCA-related cancers. BRCA-related cancers include: ? Breast. ? Ovarian. ? Tubal. ? Peritoneal cancers.  Results of the assessment will determine the need for genetic counseling and BRCA1 and BRCA2 testing.  Cervical Cancer Your health care provider may recommend that you be screened regularly for cancer of the pelvic organs (ovaries, uterus, and vagina). This screening involves a pelvic examination, including checking for microscopic changes to the surface of your cervix (Pap test). You may be encouraged to have this screening done every 3 years, beginning at age 61.  For women ages 90-65, health care providers may recommend pelvic exams and Pap testing every 3 years, or they may recommend the Pap and pelvic exam, combined with testing for human papilloma virus (HPV), every 5 years. Some types of HPV increase your risk of cervical cancer. Testing for HPV may also be done on women of any age with unclear Pap test results.  Other health care providers may not recommend any screening for nonpregnant women who are considered low risk for pelvic cancer and who do not have symptoms. Ask your health care provider if a screening pelvic exam is right for you.  If you have had past treatment for cervical cancer or a condition that could lead to cancer, you need Pap tests and screening for cancer for at least 20 years after your treatment. If Pap tests have been discontinued, your risk factors (such as having a new sexual partner) need to be reassessed to determine if screening should  resume. Some women have medical problems that increase the chance of getting cervical cancer. In these cases, your health care provider may recommend more frequent screening and Pap tests.  Colorectal Cancer  This type of cancer can be detected and often prevented.  Routine colorectal cancer screening usually begins at 49 years of age and continues through 49 years of age.  Your health care provider may recommend screening at an earlier age if you have risk factors for colon cancer.  Your health care provider may also recommend using home test kits to check for hidden blood in the stool.  A small camera at the end of a tube can be used to examine your colon directly (sigmoidoscopy or colonoscopy). This is done to check for the earliest forms of colorectal cancer.  Routine screening usually begins at age 67.  Direct examination of the colon should be repeated every 5-10 years through 49 years of age. However, you may need to be screened more often if early forms of precancerous polyps  or small growths are found.  Skin Cancer  Check your skin from head to toe regularly.  Tell your health care provider about any new moles or changes in moles, especially if there is a change in a mole's shape or color.  Also tell your health care provider if you have a mole that is larger than the size of a pencil eraser.  Always use sunscreen. Apply sunscreen liberally and repeatedly throughout the day.  Protect yourself by wearing long sleeves, pants, a wide-brimmed hat, and sunglasses whenever you are outside.  Heart disease, diabetes, and high blood pressure  High blood pressure causes heart disease and increases the risk of stroke. High blood pressure is more likely to develop in: ? People who have blood pressure in the high end of the normal range (130-139/85-89 mm Hg). ? People who are overweight or obese. ? People who are African American.  If you are 18-39 years of age, have your blood  pressure checked every 3-5 years. If you are 40 years of age or older, have your blood pressure checked every year. You should have your blood pressure measured twice-once when you are at a hospital or clinic, and once when you are not at a hospital or clinic. Record the average of the two measurements. To check your blood pressure when you are not at a hospital or clinic, you can use: ? An automated blood pressure machine at a pharmacy. ? A home blood pressure monitor.  If you are between 55 years and 79 years old, ask your health care provider if you should take aspirin to prevent strokes.  Have regular diabetes screenings. This involves taking a blood sample to check your fasting blood sugar level. ? If you are at a normal weight and have a low risk for diabetes, have this test once every three years after 49 years of age. ? If you are overweight and have a high risk for diabetes, consider being tested at a younger age or more often. Preventing infection Hepatitis B  If you have a higher risk for hepatitis B, you should be screened for this virus. You are considered at high risk for hepatitis B if: ? You were born in a country where hepatitis B is common. Ask your health care provider which countries are considered high risk. ? Your parents were born in a high-risk country, and you have not been immunized against hepatitis B (hepatitis B vaccine). ? You have HIV or AIDS. ? You use needles to inject street drugs. ? You live with someone who has hepatitis B. ? You have had sex with someone who has hepatitis B. ? You get hemodialysis treatment. ? You take certain medicines for conditions, including cancer, organ transplantation, and autoimmune conditions.  Hepatitis C  Blood testing is recommended for: ? Everyone born from 1945 through 1965. ? Anyone with known risk factors for hepatitis C.  Sexually transmitted infections (STIs)  You should be screened for sexually transmitted  infections (STIs) including gonorrhea and chlamydia if: ? You are sexually active and are younger than 49 years of age. ? You are older than 49 years of age and your health care provider tells you that you are at risk for this type of infection. ? Your sexual activity has changed since you were last screened and you are at an increased risk for chlamydia or gonorrhea. Ask your health care provider if you are at risk.  If you do not have HIV, but are at risk,   it may be recommended that you take a prescription medicine daily to prevent HIV infection. This is called pre-exposure prophylaxis (PrEP). You are considered at risk if: ? You are sexually active and do not regularly use condoms or know the HIV status of your partner(s). ? You take drugs by injection. ? You are sexually active with a partner who has HIV.  Talk with your health care provider about whether you are at high risk of being infected with HIV. If you choose to begin PrEP, you should first be tested for HIV. You should then be tested every 3 months for as long as you are taking PrEP. Pregnancy  If you are premenopausal and you may become pregnant, ask your health care provider about preconception counseling.  If you may become pregnant, take 400 to 800 micrograms (mcg) of folic acid every day.  If you want to prevent pregnancy, talk to your health care provider about birth control (contraception). Osteoporosis and menopause  Osteoporosis is a disease in which the bones lose minerals and strength with aging. This can result in serious bone fractures. Your risk for osteoporosis can be identified using a bone density scan.  If you are 65 years of age or older, or if you are at risk for osteoporosis and fractures, ask your health care provider if you should be screened.  Ask your health care provider whether you should take a calcium or vitamin D supplement to lower your risk for osteoporosis.  Menopause may have certain physical  symptoms and risks.  Hormone replacement therapy may reduce some of these symptoms and risks. Talk to your health care provider about whether hormone replacement therapy is right for you. Follow these instructions at home:  Schedule regular health, dental, and eye exams.  Stay current with your immunizations.  Do not use any tobacco products including cigarettes, chewing tobacco, or electronic cigarettes.  If you are pregnant, do not drink alcohol.  If you are breastfeeding, limit how much and how often you drink alcohol.  Limit alcohol intake to no more than 1 drink per day for nonpregnant women. One drink equals 12 ounces of beer, 5 ounces of wine, or 1 ounces of hard liquor.  Do not use street drugs.  Do not share needles.  Ask your health care provider for help if you need support or information about quitting drugs.  Tell your health care provider if you often feel depressed.  Tell your health care provider if you have ever been abused or do not feel safe at home. This information is not intended to replace advice given to you by your health care provider. Make sure you discuss any questions you have with your health care provider. Document Released: 07/03/2010 Document Revised: 05/26/2015 Document Reviewed: 09/21/2014 [employer]vier Interactive Patient Education  2018 [employer]  American Heart Association (AHA) Exercise Recommendation  Being physically active is important to prevent heart disease and stroke, the nation's No. 1and No. 5killers. To improve overall cardiovascular health, we suggest at least 150 minutes per week of moderate exercise or 75 minutes per week of vigorous exercise (or a combination of moderate and vigorous activity). Thirty minutes a day, five times a week is an easy goal to remember. You will also experience benefits even if you divide your time into two or three segments of 10 to 15 minutes per day.  For people who would benefit from lowering their  blood pressure or cholesterol, we recommend 40 minutes of aerobic exercise of moderate to   vigorous intensity three to four times a week to lower the risk for heart attack and stroke.  Physical activity is anything that makes you move your body and burn calories.  This includes things like climbing stairs or playing sports. Aerobic exercises benefit your heart, and include walking, jogging, swimming or biking. Strength and stretching exercises are best for overall stamina and flexibility.  The simplest, positive change you can make to effectively improve your heart health is to start walking. It's enjoyable, free, easy, social and great exercise. A walking program is flexible and boasts high success rates because people can stick with it. It's easy for walking to become a regular and satisfying part of life.   For Overall Cardiovascular Health:  At least 30 minutes of moderate-intensity aerobic activity at least 5 days per week for a total of 150  OR   At least 25 minutes of vigorous aerobic activity at least 3 days per week for a total of 75 minutes; or a combination of moderate- and vigorous-intensity aerobic activity  AND   Moderate- to high-intensity muscle-strengthening activity at least 2 days per week for additional health benefits.  For Lowering Blood Pressure and Cholesterol  An average 40 minutes of moderate- to vigorous-intensity aerobic activity 3 or 4 times per week  What if I can't make it to the time goal? Something is always better than nothing! And everyone has to start somewhere. Even if you've been sedentary for years, today is the day you can begin to make healthy changes in your life. If you don't think you'll make it for 30 or 40 minutes, set a reachable goal for today. You can work up toward your overall goal by increasing your time as you get stronger. Don't let all-or-nothing thinking rob you of doing what you can every day.  Source:http://www.heart.org

## 2017-01-08 NOTE — Progress Notes (Signed)
Tara Alvarado 47 y.o.   Chief Complaint  Patient presents with  . Annual Exam    HISTORY OF PRESENT ILLNESS: This is a 47 y.o. female Here for annual exam; no complaints and no medical concerns.   HPI   Prior to Admission medications   Not on File    No Known Allergies  There are no active problems to display for this patient.   Past Medical History:  Diagnosis Date  . Allergy   . Anxiety     Past Surgical History:  Procedure Laterality Date  . CESAREAN SECTION      Social History   Socioeconomic History  . Marital status: Married    Spouse name: Not on file  . Number of children: Not on file  . Years of education: Not on file  . Highest education level: Not on file  Social Needs  . Financial resource strain: Not on file  . Food insecurity - worry: Not on file  . Food insecurity - inability: Not on file  . Transportation needs - medical: Not on file  . Transportation needs - non-medical: Not on file  Occupational History  . Not on file  Tobacco Use  . Smoking status: Former Research scientist (life sciences)  . Smokeless tobacco: Never Used  Substance and Sexual Activity  . Alcohol use: No  . Drug use: No  . Sexual activity: No  Other Topics Concern  . Not on file  Social History Narrative  . Not on file    Family History  Problem Relation Age of Onset  . Heart disease Mother 48       AMI/CAD  . Cancer Mother        uterus  . Heart disease Father 42       AMI x 2  . Hyperlipidemia Father   . Cancer Father        kidney  . CAD Other   . Cancer Other        breast  . Heart disease Paternal Grandmother   . Cancer Paternal Grandmother        stomach     Review of Systems  Constitutional: Negative.  Negative for chills, fever and weight loss.  HENT: Negative.   Eyes: Negative.   Respiratory: Positive for shortness of breath.   Cardiovascular: Negative.   Gastrointestinal: Negative.   Genitourinary: Negative.   Musculoskeletal: Negative.   Skin: Positive  for rash.  Neurological: Negative.  Negative for dizziness and headaches.  Endo/Heme/Allergies:       Frequent allergies  All other systems reviewed and are negative.  Vitals:   01/08/17 0852  BP: 138/86  Pulse: 83  Resp: 16  Temp: 98.3 F (36.8 C)  SpO2: 98%     Physical Exam  Constitutional: She is oriented to person, place, and time. She appears well-developed and well-nourished.  HENT:  Head: Normocephalic.  Right Ear: External ear normal.  Left Ear: External ear normal.  Nose: Nose normal.  Mouth/Throat: Oropharynx is clear and moist.  Eyes: Conjunctivae and EOM are normal. Pupils are equal, round, and reactive to light.  Neck: Normal range of motion. Neck supple. No JVD present. No thyromegaly present.  Cardiovascular: Normal rate, regular rhythm, normal heart sounds and intact distal pulses.  Pulmonary/Chest: Effort normal and breath sounds normal.  Abdominal: Soft. Bowel sounds are normal. She exhibits no distension. There is no tenderness. Hernia confirmed negative in the right inguinal area and confirmed negative in the left inguinal area.  Genitourinary:  Vagina normal and uterus normal. There is no rash, tenderness or lesion on the right labia. There is no rash, tenderness or lesion on the left labia. Cervix exhibits no motion tenderness, no discharge and no friability.  Musculoskeletal: Normal range of motion.  Lymphadenopathy:    She has no cervical adenopathy. No inguinal adenopathy noted on the right or left side.  Neurological: She is alert and oriented to person, place, and time. No sensory deficit. She exhibits normal muscle tone. Coordination normal.  Skin: Skin is warm and dry. Capillary refill takes less than 2 seconds.  Psychiatric: She has a normal mood and affect. Her behavior is normal.  Vitals reviewed.    ASSESSMENT & PLAN: Tara Alvarado was seen today for annual exam.  Diagnoses and all orders for this visit:  Routine general medical examination at a  health care facility -     CBC with Differential -     HIV antibody -     Lipid panel -     Hemoglobin A1c -     Comprehensive metabolic panel -     Pap IG and Chlamydia/Gonococcus, NAA -     DG Chest 2 View; Future   Patient Instructions       IF you received an x-ray today, you will receive an invoice from [facility]. Please contact [facility] at [phone] with questions or concerns regarding your invoice.   IF you received labwork today, you will receive an invoice from [facility]. Please contact [facility] at [phone] with questions or concerns regarding your invoice.   Our billing staff will not be able to assist you with questions regarding bills from these companies.  You will be contacted with the lab results as soon as they are available. The fastest way to get your results is to activate your My Chart account. Instructions are located on the last page of this paperwork. If you have not heard from [place] regarding the results in 2 weeks, please contact this office.     Health Maintenance, Female Adopting a healthy lifestyle and getting preventive care can go a long way to promote health and wellness. Talk with your health care provider about what schedule of regular examinations is right for you. This is a good chance for you to check in with your provider about disease prevention and staying healthy. In between checkups, there are plenty of things you can do on your own. Experts have done a lot of research about which lifestyle changes and preventive measures are most likely to keep you healthy. Ask your health care provider for more information. Weight and diet Eat a healthy diet  Be sure to include plenty of vegetables, fruits, low-fat dairy products, and lean protein.  Do not eat a lot of foods high in solid fats, added sugars, or salt.  Get regular exercise. This is one of the most important things you can do for your health. ? Most adults should  exercise for at least 150 minutes each week. The exercise should increase your heart rate and make you sweat (moderate-intensity exercise). ? Most adults should also do strengthening exercises at least twice a week. This is in addition to the moderate-intensity exercise.  Maintain a healthy weight  Body mass index (BMI) is a measurement that can be used to identify possible weight problems. It estimates body fat based on height and weight. Your health care provider can help determine your BMI and help you achieve or maintain a healthy weight.  For females  45 years of age and older: ? A BMI below 18.5 is considered underweight. ? A BMI of 18.5 to 24.9 is normal. ? A BMI of 25 to 29.9 is considered overweight. ? A BMI of 30 and above is considered obese.  Watch levels of cholesterol and blood lipids  You should start having your blood tested for lipids and cholesterol at 49 years of age, then have this test every 5 years.  You may need to have your cholesterol levels checked more often if: ? Your lipid or cholesterol levels are high. ? You are older than 49 years of age. ? You are at high risk for heart disease.  Cancer screening Lung Cancer  Lung cancer screening is recommended for adults 59-57 years old who are at high risk for lung cancer because of a history of smoking.  A yearly low-dose CT scan of the lungs is recommended for people who: ? Currently smoke. ? Have quit within the past 15 years. ? Have at least a 30-pack-year history of smoking. A pack year is smoking an average of one pack of cigarettes a day for 1 year.  Yearly screening should continue until it has been 15 years since you quit.  Yearly screening should stop if you develop a health problem that would prevent you from having lung cancer treatment.  Breast Cancer  Practice breast self-awareness. This means understanding how your breasts normally appear and feel.  It also means doing regular breast  self-exams. Let your health care provider know about any changes, no matter how small.  If you are in your 20s or 30s, you should have a clinical breast exam (CBE) by a health care provider every 1-3 years as part of a regular health exam.  If you are 58 or older, have a CBE every year. Also consider having a breast X-ray (mammogram) every year.  If you have a family history of breast cancer, talk to your health care provider about genetic screening.  If you are at high risk for breast cancer, talk to your health care provider about having an MRI and a mammogram every year.  Breast cancer gene (BRCA) assessment is recommended for women who have family members with BRCA-related cancers. BRCA-related cancers include: ? Breast. ? Ovarian. ? Tubal. ? Peritoneal cancers.  Results of the assessment will determine the need for genetic counseling and BRCA1 and BRCA2 testing.  Cervical Cancer Your health care provider may recommend that you be screened regularly for cancer of the pelvic organs (ovaries, uterus, and vagina). This screening involves a pelvic examination, including checking for microscopic changes to the surface of your cervix (Pap test). You may be encouraged to have this screening done every 3 years, beginning at age 75.  For women ages 65-65, health care providers may recommend pelvic exams and Pap testing every 3 years, or they may recommend the Pap and pelvic exam, combined with testing for human papilloma virus (HPV), every 5 years. Some types of HPV increase your risk of cervical cancer. Testing for HPV may also be done on women of any age with unclear Pap test results.  Other health care providers may not recommend any screening for nonpregnant women who are considered low risk for pelvic cancer and who do not have symptoms. Ask your health care provider if a screening pelvic exam is right for you.  If you have had past treatment for cervical cancer or a condition that could  lead to cancer, you need Pap tests and  screening for cancer for at least 20 years after your treatment. If Pap tests have been discontinued, your risk factors (such as having a new sexual partner) need to be reassessed to determine if screening should resume. Some women have medical problems that increase the chance of getting cervical cancer. In these cases, your health care provider may recommend more frequent screening and Pap tests.  Colorectal Cancer  This type of cancer can be detected and often prevented.  Routine colorectal cancer screening usually begins at 49 years of age and continues through 49 years of age.  Your health care provider may recommend screening at an earlier age if you have risk factors for colon cancer.  Your health care provider may also recommend using home test kits to check for hidden blood in the stool.  A small camera at the end of a tube can be used to examine your colon directly (sigmoidoscopy or colonoscopy). This is done to check for the earliest forms of colorectal cancer.  Routine screening usually begins at age 66.  Direct examination of the colon should be repeated every 5-10 years through 49 years of age. However, you may need to be screened more often if early forms of precancerous polyps or small growths are found.  Skin Cancer  Check your skin from head to toe regularly.  Tell your health care provider about any new moles or changes in moles, especially if there is a change in a mole's shape or color.  Also tell your health care provider if you have a mole that is larger than the size of a pencil eraser.  Always use sunscreen. Apply sunscreen liberally and repeatedly throughout the day.  Protect yourself by wearing long sleeves, pants, a wide-brimmed hat, and sunglasses whenever you are outside.  Heart disease, diabetes, and high blood pressure  High blood pressure causes heart disease and increases the risk of stroke. High blood pressure  is more likely to develop in: ? People who have blood pressure in the high end of the normal range (130-139/85-89 mm Hg). ? People who are overweight or obese. ? People who are African American.  If you are 22-21 years of age, have your blood pressure checked every 3-5 years. If you are 20 years of age or older, have your blood pressure checked every year. You should have your blood pressure measured twice-once when you are at a hospital or clinic, and once when you are not at a hospital or clinic. Record the average of the two measurements. To check your blood pressure when you are not at a hospital or clinic, you can use: ? An automated blood pressure machine at a pharmacy. ? A home blood pressure monitor.  If you are between 36 years and 56 years old, ask your health care provider if you should take aspirin to prevent strokes.  Have regular diabetes screenings. This involves taking a blood sample to check your fasting blood sugar level. ? If you are at a normal weight and have a low risk for diabetes, have this test once every three years after 49 years of age. ? If you are overweight and have a high risk for diabetes, consider being tested at a younger age or more often. Preventing infection Hepatitis B  If you have a higher risk for hepatitis B, you should be screened for this virus. You are considered at high risk for hepatitis B if: ? You were born in a country where hepatitis B is common. Ask your  health care provider which countries are considered high risk. ? Your parents were born in a high-risk country, and you have not been immunized against hepatitis B (hepatitis B vaccine). ? You have HIV or AIDS. ? You use needles to inject street drugs. ? You live with someone who has hepatitis B. ? You have had sex with someone who has hepatitis B. ? You get hemodialysis treatment. ? You take certain medicines for conditions, including cancer, organ transplantation, and autoimmune  conditions.  Hepatitis C  Blood testing is recommended for: ? Everyone born from [birth year] through 1967. ? Anyone with known risk factors for hepatitis C.  Sexually transmitted infections (STIs)  You should be screened for sexually transmitted infections (STIs) including gonorrhea and chlamydia if: ? You are sexually active and are younger than 49 years of age. ? You are older than 49 years of age and your health care provider tells you that you are at risk for this type of infection. ? Your sexual activity has changed since you were last screened and you are at an increased risk for chlamydia or gonorrhea. Ask your health care provider if you are at risk.  If you do not have HIV, but are at risk, it may be recommended that you take a prescription medicine daily to prevent HIV infection. This is called pre-exposure prophylaxis (PrEP). You are considered at risk if: ? You are sexually active and do not regularly use condoms or know the HIV status of your partner(s). ? You take drugs by injection. ? You are sexually active with a partner who has HIV.  Talk with your health care provider about whether you are at high risk of being infected with HIV. If you choose to begin PrEP, you should first be tested for HIV. You should then be tested every 3 months for as long as you are taking PrEP. Pregnancy  If you are premenopausal and you may become pregnant, ask your health care provider about preconception counseling.  If you may become pregnant, take 400 to 800 micrograms (mcg) of folic acid every day.  If you want to prevent pregnancy, talk to your health care provider about birth control (contraception). Osteoporosis and menopause  Osteoporosis is a disease in which the bones lose minerals and strength with aging. This can result in serious bone fractures. Your risk for osteoporosis can be identified using a bone density scan.  If you are 69 years of age or older, or if you are at risk for  osteoporosis and fractures, ask your health care provider if you should be screened.  Ask your health care provider whether you should take a calcium or vitamin D supplement to lower your risk for osteoporosis.  Menopause may have certain physical symptoms and risks.  Hormone replacement therapy may reduce some of these symptoms and risks. Talk to your health care provider about whether hormone replacement therapy is right for you. Follow these instructions at home:  Schedule regular health, dental, and eye exams.  Stay current with your immunizations.  Do not use any tobacco products including cigarettes, chewing tobacco, or electronic cigarettes.  If you are pregnant, do not drink alcohol.  If you are breastfeeding, limit how much and how often you drink alcohol.  Limit alcohol intake to no more than 1 drink per day for nonpregnant women. One drink equals 12 ounces of beer, 5 ounces of wine, or 1 ounces of hard liquor.  Do not use street drugs.  Do not share needles.  Ask your health care provider for help if you need support or information about quitting drugs.  Tell your health care provider if you often feel depressed.  Tell your health care provider if you have ever been abused or do not feel safe at home. This information is not intended to replace advice given to you by your health care provider. Make sure you discuss any questions you have with your health care provider. Document Released: 07/03/2010 Document Revised: 05/26/2015 Document Reviewed: 09/21/2014 Elsevier Interactive Patient Education  2018 [place] (AHA) Exercise Recommendation  Being physically active is important to prevent heart disease and stroke, the nation's No. 1and No. 5killers. To improve overall cardiovascular health, we suggest at least 150 minutes per week of moderate exercise or 75 minutes per week of vigorous exercise (or a combination of moderate and vigorous  activity). Thirty minutes a day, five times a week is an easy goal to remember. You will also experience benefits even if you divide your time into two or three segments of 10 to 15 minutes per day.  For people who would benefit from lowering their blood pressure or cholesterol, we recommend 40 minutes of aerobic exercise of moderate to vigorous intensity three to four times a week to lower the risk for heart attack and stroke.  Physical activity is anything that makes you move your body and burn calories.  This includes things like climbing stairs or playing sports. Aerobic exercises benefit your heart, and include walking, jogging, swimming or biking. Strength and stretching exercises are best for overall stamina and flexibility.  The simplest, positive change you can make to effectively improve your heart health is to start walking. It's enjoyable, free, easy, social and great exercise. A walking program is flexible and boasts high success rates because people can stick with it. It's easy for walking to become a regular and satisfying part of life.   For Overall Cardiovascular Health:  At least 30 minutes of moderate-intensity aerobic activity at least 5 days per week for a total of 150  OR   At least 25 minutes of vigorous aerobic activity at least 3 days per week for a total of 75 minutes; or a combination of moderate- and vigorous-intensity aerobic activity  AND   Moderate- to high-intensity muscle-strengthening activity at least 2 days per week for additional health benefits.  For Lowering Blood Pressure and Cholesterol  An average 40 minutes of moderate- to vigorous-intensity aerobic activity 3 or 4 times per week  What if I can't make it to the time goal? Something is always better than nothing! And everyone has to start somewhere. Even if you've been sedentary for years, today is the day you can begin to make healthy changes in your life. If you don't think you'll make it for  30 or 40 minutes, set a reachable goal for today. You can work up toward your overall goal by increasing your time as you get stronger. Don't let all-or-nothing thinking rob you of doing what you can every day.  Source:[email or web address].heart.Burnadette Pop, MD [facility]

## 2017-01-09 ENCOUNTER — Encounter: Payer: Self-pay | Admitting: Emergency Medicine

## 2017-01-09 LAB — CBC WITH DIFFERENTIAL/PLATELET
Basophils Absolute: 0 10*3/uL (ref 0.0–0.2)
Basos: 1 %
EOS (ABSOLUTE): 0.1 10*3/uL (ref 0.0–0.4)
EOS: 2 %
HEMATOCRIT: 40.6 % (ref 34.0–46.6)
HEMOGLOBIN: 13.8 g/dL (ref 11.1–15.9)
IMMATURE GRANS (ABS): 0 10*3/uL (ref 0.0–0.1)
IMMATURE GRANULOCYTES: 0 %
Lymphocytes Absolute: 0.6 10*3/uL — ABNORMAL LOW (ref 0.7–3.1)
Lymphs: 14 %
MCH: 29.3 pg (ref 26.6–33.0)
MCHC: 34 g/dL (ref 31.5–35.7)
MCV: 86 fL (ref 79–97)
MONOCYTES: 8 %
Monocytes Absolute: 0.3 10*3/uL (ref 0.1–0.9)
NEUTROS PCT: 75 %
Neutrophils Absolute: 3.2 10*3/uL (ref 1.4–7.0)
Platelets: 278 10*3/uL (ref 150–379)
RBC: 4.71 x10E6/uL (ref 3.77–5.28)
RDW: 13.9 % (ref 12.3–15.4)
WBC: 4.3 10*3/uL (ref 3.4–10.8)

## 2017-01-09 LAB — COMPREHENSIVE METABOLIC PANEL
A/G RATIO: 1.7 (ref 1.2–2.2)
ALBUMIN: 4.9 g/dL (ref 3.5–5.5)
ALT: 20 IU/L (ref 0–32)
AST: 21 IU/L (ref 0–40)
Alkaline Phosphatase: 83 IU/L (ref 39–117)
BILIRUBIN TOTAL: 0.7 mg/dL (ref 0.0–1.2)
BUN / CREAT RATIO: 13 (ref 9–23)
BUN: 10 mg/dL (ref 6–24)
CO2: 22 mmol/L (ref 20–29)
Calcium: 9.8 mg/dL (ref 8.7–10.2)
Chloride: 102 mmol/L (ref 96–106)
Creatinine, Ser: 0.77 mg/dL (ref 0.57–1.00)
GFR calc non Af Amer: 92 mL/min/{1.73_m2} (ref >59)
GFR, EST AFRICAN AMERICAN: 106 mL/min/{1.73_m2} (ref >59)
Globulin, Total: 2.9 g/dL (ref 1.5–4.5)
Glucose: 93 mg/dL (ref 65–99)
Potassium: 4.3 mmol/L (ref 3.5–5.2)
Sodium: 140 mmol/L (ref 134–144)
TOTAL PROTEIN: 7.8 g/dL (ref 6.0–8.5)

## 2017-01-09 LAB — LIPID PANEL
Chol/HDL Ratio: 3.2 ratio (ref 0.0–4.4)
Cholesterol, Total: 232 mg/dL — ABNORMAL HIGH (ref 100–199)
HDL: 72 mg/dL (ref >39)
LDL CALC: 141 mg/dL — AB (ref 0–99)
TRIGLYCERIDES: 93 mg/dL (ref 0–149)
VLDL CHOLESTEROL CAL: 19 mg/dL (ref 5–40)

## 2017-01-09 LAB — PAP IG AND CT-NG NAA
CHLAMYDIA, NUC. ACID AMP: NEGATIVE
Gonococcus by Nucleic Acid Amp: NEGATIVE
PAP SMEAR COMMENT: 0

## 2017-01-09 LAB — HEMOGLOBIN A1C
Est. average glucose Bld gHb Est-mCnc: 108 mg/dL
Hgb A1c MFr Bld: 5.4 % (ref 4.8–5.6)

## 2017-01-09 LAB — HIV ANTIBODY (ROUTINE TESTING W REFLEX): HIV Screen 4th Generation wRfx: NONREACTIVE

## 2017-01-11 ENCOUNTER — Encounter: Payer: Self-pay | Admitting: Emergency Medicine

## 2017-05-04 ENCOUNTER — Encounter: Payer: Self-pay | Admitting: Emergency Medicine

## 2017-10-01 ENCOUNTER — Ambulatory Visit: Payer: Self-pay | Admitting: *Deleted

## 2017-10-01 NOTE — Telephone Encounter (Signed)
Pt reports chest pain, increased fatigue, poor endurance and mild SOB with activity only.  Onset 2 months ago; worsening past 2 weeks. States CP is intermittent, worse after physical activity ie lifting at work.  Also reports  "More tired than usual, have to rest more at work."  Reports mild SOB with exertion, resolves after sitting down to rest. States BP last week 110/60 with mild lightheadedness at that time, brief episode. Pt states does a lot of lifting and pushing at work. Requesting appt for Thursday at 1100 or earlier due to work schedule. Noted Dr. Alvy Bimler is MD for acute issues, all appts Same Day.  Called office and spoke with [facility]. States to send for review.   Please advise: [phone]   Reason for Disposition . [1] Chest pain(s) lasting a few seconds AND [2] persists > 3 days  Answer Assessment - Initial Assessment Questions 1. LOCATION: "Where does it hurt?"       Upper middle, towards left at times 2. RADIATION: "Does the pain go anywhere else?" (e.g., into neck, jaw, arms, back)     no 3. ONSET: "When did the chest pain begin?" (Minutes, hours or days)      2 months ago 4. PATTERN "Does the pain come and go, or has it been constant since it started?"  "Does it get worse with exertion?"      Intermittent, worse after lifting at work 5. DURATION: "How long does it last" (e.g., seconds, minutes, hours)     Varies 6. SEVERITY: "How bad is the pain?"  (e.g., Scale 1-10; mild, moderate, or severe)    - MILD (1-3): doesn't interfere with normal activities     - MODERATE (4-7): interferes with normal activities or awakens from sleep    - SEVERE (8-10): excruciating pain, unable to do any normal activities       8/10 at times  10/10 after physical activity at work 7. CARDIAC RISK FACTORS: "Do you have any history of heart problems or risk factors for heart disease?" (e.g., prior heart attack, angina; high blood pressure, diabetes, being overweight, high cholesterol, smoking, or  strong family history of heart disease)     no 8. PULMONARY RISK FACTORS: "Do you have any history of lung disease?"  (e.g., blood clots in lung, asthma, emphysema, birth control pills)     no 9. CAUSE: "What do you think is causing the chest pain?"     Unsure, maybe muscular 10. OTHER SYMPTOMS: "Do you have any other symptoms?" (e.g., dizziness, nausea, vomiting, sweating, fever, difficulty breathing, cough)    Increased fatigue, poor endurance, mild SOB with exertion. BP lower than usual 110/60 last week  Protocols used: CHEST PAIN-A-AH

## 2017-10-02 NOTE — Telephone Encounter (Signed)
Pt calling to check on appt. Requesting tomorrow at 1100 or earlier due to work schedule. See prior encounters.

## 2017-10-02 NOTE — Telephone Encounter (Signed)
Message sent to scheduling to make appt with PCP  ASAP.

## 2017-10-02 NOTE — Telephone Encounter (Addendum)
Scheduled Same Day appt for tomorrow at 1100 with Dr. Alvy Bimler.

## 2017-10-03 ENCOUNTER — Ambulatory Visit (INDEPENDENT_AMBULATORY_CARE_PROVIDER_SITE_OTHER): Payer: Managed Care, Other (non HMO) | Admitting: Emergency Medicine

## 2017-10-03 ENCOUNTER — Encounter: Payer: Self-pay | Admitting: Emergency Medicine

## 2017-10-03 VITALS — BP 156/83 | HR 71 | Temp 98.3°F | Resp 16 | Ht <= 58 in | Wt 133.4 lb

## 2017-10-03 DIAGNOSIS — R5381 Other malaise: Secondary | ICD-10-CM

## 2017-10-03 DIAGNOSIS — R5383 Other fatigue: Secondary | ICD-10-CM

## 2017-10-03 DIAGNOSIS — M940 Chondrocostal junction syndrome [Tietze]: Secondary | ICD-10-CM | POA: Diagnosis not present

## 2017-10-03 DIAGNOSIS — F4323 Adjustment disorder with mixed anxiety and depressed mood: Secondary | ICD-10-CM | POA: Diagnosis not present

## 2017-10-03 MED ORDER — DULOXETINE HCL 30 MG PO CPEP: 30.0000 mg | ORAL_CAPSULE | Freq: Every day | ORAL | 3 refills | Status: DC

## 2017-10-03 NOTE — Patient Instructions (Addendum)
   If you have lab work done today you will be contacted with your lab results within the next 2 weeks.  If you have not heard from us then please contact us. The fastest way to get your results is to register for My Chart.   IF you received an x-ray today, you will receive an invoice from [facility]. Please contact [facility] at [phone] with questions or concerns regarding your invoice.   IF you received labwork today, you will receive an invoice from [facility]. Please contact [facility] at [phone] with questions or concerns regarding your invoice.   Our billing staff will not be able to assist you with questions regarding bills from these companies.  You will be contacted with the lab results as soon as they are available. The fastest way to get your results is to activate your My Chart account. Instructions are located on the last page of this paperwork. If you have not heard from us regarding the results in 2 weeks, please contact this office.      Living With Anxiety After being diagnosed with an anxiety disorder, you may be relieved to know why you have felt or behaved a certain way. It is natural to also feel overwhelmed about the treatment ahead and what it will mean for your life. With care and support, you can manage this condition and recover from it. How to cope with anxiety Dealing with stress Stress is your body's reaction to life changes and events, both good and bad. Stress can last just a few hours or it can be ongoing. Stress can play a major role in anxiety, so it is important to learn both how to cope with stress and how to think about it differently. Talk with your health care provider or a counselor to learn more about stress reduction. He or she may suggest some stress reduction techniques, such as:  Music therapy. This can include creating or listening to music that you enjoy and that inspires you.  Mindfulness-based meditation. This  involves being aware of your normal breaths, rather than trying to control your breathing. It can be done while sitting or walking.  Centering prayer. This is a kind of meditation that involves focusing on a word, phrase, or sacred image that is meaningful to you and that brings you peace.  Deep breathing. To do this, expand your stomach and inhale slowly through your nose. Hold your breath for 3-5 seconds. Then exhale slowly, allowing your stomach muscles to relax.  Self-talk. This is a skill where you identify thought patterns that lead to anxiety reactions and correct those thoughts.  Muscle relaxation. This involves tensing muscles then relaxing them.  Choose a stress reduction technique that fits your lifestyle and personality. Stress reduction techniques take time and practice. Set aside 5-15 minutes a day to do them. Therapists can offer training in these techniques. The training may be covered by some insurance plans. Other things you can do to manage stress include:  Keeping a stress diary. This can help you learn what triggers your stress and ways to control your response.  Thinking about how you respond to certain situations. You may not be able to control everything, but you can control your reaction.  Making time for activities that help you relax, and not feeling guilty about spending your time in this way.  Therapy combined with coping and stress-reduction skills provides the best chance for successful treatment. Medicines Medicines can help ease symptoms. Medicines for   anxiety include:  Anti-anxiety drugs.  Antidepressants.  Beta-blockers.  Medicines may be used as the main treatment for anxiety disorder, along with therapy, or if other treatments are not working. Medicines should be prescribed by a health care provider. Relationships Relationships can play a big part in helping you recover. Try to spend more time connecting with trusted friends and family members.  Consider going to couples counseling, taking family education classes, or going to family therapy. Therapy can help you and others better understand the condition. How to recognize changes in your condition Everyone has a different response to treatment for anxiety. Recovery from anxiety happens when symptoms decrease and stop interfering with your daily activities at home or work. This may mean that you will start to:  Have better concentration and focus.  Sleep better.  Be less irritable.  Have more energy.  Have improved memory.  It is important to recognize when your condition is getting worse. Contact your health care provider if your symptoms interfere with home or work and you do not feel like your condition is improving. Where to find help and support: You can get help and support from these sources:  Self-help groups.  Online and community organizations.  A trusted spiritual leader.  Couples counseling.  Family education classes.  Family therapy.  Follow these instructions at home:  Eat a healthy diet that includes plenty of vegetables, fruits, whole grains, low-fat dairy products, and lean protein. Do not eat a lot of foods that are high in solid fats, added sugars, or salt.  Exercise. Most adults should do the following: ? Exercise for at least 150 minutes each week. The exercise should increase your heart rate and make you sweat (moderate-intensity exercise). ? Strengthening exercises at least twice a week.  Cut down on caffeine, tobacco, alcohol, and other potentially harmful substances.  Get the right amount and quality of sleep. Most adults need 7-9 hours of sleep each night.  Make choices that simplify your life.  Take over-the-counter and prescription medicines only as told by your health care provider.  Avoid caffeine, alcohol, and certain over-the-counter cold medicines. These may make you feel worse. Ask your pharmacist which medicines to  avoid.  Keep all follow-up visits as told by your health care provider. This is important. Questions to ask your health care provider  Would I benefit from therapy?  How often should I follow up with a health care provider?  How long do I need to take medicine?  Are there any long-term side effects of my medicine?  Are there any alternatives to taking medicine? Contact a health care provider if:  You have a hard time staying focused or finishing daily tasks.  You spend many hours a day feeling worried about everyday life.  You become exhausted by worry.  You start to have headaches, feel tense, or have nausea.  You urinate more than normal.  You have diarrhea. Get help right away if:  You have a racing heart and shortness of breath.  You have thoughts of hurting yourself or others. If you ever feel like you may hurt yourself or others, or have thoughts about taking your own life, get help right away. You can go to your nearest emergency department or call:  Your local emergency services (911 in the [place]  A suicide crisis helpline, such as the National Suicide Prevention Lifeline at 1-800-273-8255. This is open 24-hours a day.  Summary  Taking steps to deal with stress can help calm   you.  Medicines cannot cure anxiety disorders, but they can help ease symptoms.  Family, friends, and partners can play a big part in helping you recover from an anxiety disorder. This information is not intended to replace advice given to you by your health care provider. Make sure you discuss any questions you have with your health care provider. Document Released: 12/13/2015 Document Revised: 12/13/2015 Document Reviewed: 12/13/2015 Elsevier Interactive Patient Education  2018 [employer]vier Inc.  

## 2017-10-03 NOTE — Progress Notes (Signed)
Tara Alvarado 47 y.o.   Chief Complaint  Patient presents with  . Fatigue    x1-2 months; feels very tired; no motivation  . Shortness of Breath    comes with the fatigue and chest pressure  . chest pressure    feels pressure around her heart area; has to push on chest to relieve pain/pressure feeling   Depression screen PHQ 2/9 01/08/2017  Decreased Interest 0  Down, Depressed, Hopeless 0  PHQ - 2 Score 0    HISTORY OF PRESENT ILLNESS: This is a 47 y.o. female complaining of 2 issues: 1.  Increased stress and anxiety at home. 2.  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  She works 7 to 8 hours a day 5 days a week. Complaining of increased fatigue with no motivation, decreased appetite and disinterest.  Fatigue with chest pressure and shortness of breath happens at the end of the workday.  Chest pain reproduced with palpation.  Patient has no chronic medical problems.  No history of heart disease.  HPI   Prior to Admission medications   Not on File    No Known Allergies  There are no active problems to display for this patient.   Past Medical History:  Diagnosis Date  . Allergy   . Anxiety     Past Surgical History:  Procedure Laterality Date  . CESAREAN SECTION      Social History   Socioeconomic History  . Marital status: Married    Spouse name: Not on file  . Number of children: Not on file  . Years of education: Not on file  . Highest education level: Not on file  Occupational History  . Not on file  Social Needs  . Financial resource strain: Not on file  . Food insecurity:    Worry: Not on file    Inability: Not on file  . Transportation needs:    Medical: Not on file    Non-medical: Not on file  Tobacco Use  . Smoking status: Former Games developer  . Smokeless tobacco: Never Used  Substance and Sexual Activity  . Alcohol use: No  . Drug use: No  . Sexual activity: Never  Lifestyle  . Physical  activity:    Days per week: Not on file    Minutes per session: Not on file  . Stress: Not on file  Relationships  . Social connections:    Talks on phone: Not on file    Gets together: Not on file    Attends religious service: Not on file    Active member of club or organization: Not on file    Attends meetings of clubs or organizations: Not on file    Relationship status: Not on file  . Intimate partner violence:    Fear of current or ex partner: Not on file    Emotionally abused: Not on file    Physically abused: Not on file    Forced sexual activity: Not on file  Other Topics Concern  . Not on file  Social History Narrative  . Not on file    Family History  Problem Relation Age of Onset  . Heart disease Mother 65       AMI/CAD  . Cancer Mother        uterus  . Heart disease Father 49       AMI x 2  . Hyperlipidemia Father   . Cancer Father  kidney  . CAD Other   . Cancer Other        breast  . Heart disease Paternal Grandmother   . Cancer Paternal Grandmother        stomach     Review of Systems  Constitutional: Positive for malaise/fatigue.  Eyes: Negative.   Respiratory: Negative.  Negative for cough and shortness of breath.   Cardiovascular: Negative.  Negative for chest pain and palpitations.  Gastrointestinal: Negative for abdominal pain, diarrhea, nausea and vomiting.  Genitourinary: Negative.  Negative for dysuria.  Musculoskeletal: Positive for back pain and myalgias.  Skin: Negative.  Negative for rash.  Neurological: Negative.  Negative for dizziness and headaches.  Endo/Heme/Allergies: Negative.   Psychiatric/Behavioral: Positive for depression. The patient is nervous/anxious.   All other systems reviewed and are negative.   Vitals:   10/03/17 1110  BP: (!) 156/83  Pulse: 71  Resp: 16  Temp: 98.3 F (36.8 C)  SpO2: 98%    Physical Exam  Constitutional: She is oriented to person, place, and time. She appears well-developed and  well-nourished.  HENT:  Head: Normocephalic and atraumatic.  Nose: Nose normal.  Mouth/Throat: Oropharynx is clear and moist.  Eyes: Pupils are equal, round, and reactive to light. Conjunctivae and EOM are normal.  Neck: Normal range of motion. Neck supple. No JVD present.  Cardiovascular: Normal rate, regular rhythm and normal heart sounds.  Pulmonary/Chest: Effort normal and breath sounds normal. She exhibits tenderness (Left upper chest, pain reproduced with palpation).  Abdominal: Soft. Bowel sounds are normal. She exhibits no distension. There is no tenderness.  Musculoskeletal: Normal range of motion.  Lymphadenopathy:    She has no cervical adenopathy.  Neurological: She is alert and oriented to person, place, and time. No sensory deficit. She exhibits normal muscle tone.  Skin: Skin is warm and dry. Capillary refill takes less than 2 seconds.  Psychiatric: She has a normal mood and affect. Her behavior is normal.  Vitals reviewed.    ASSESSMENT & PLAN:  Rebecca was seen today for fatigue, shortness of breath and chest pressure.  Diagnoses and all orders for this visit:  Malaise and fatigue  Adjustment reaction with anxiety and depression -     DULoxetine (CYMBALTA) 30 MG capsule; Take 1 capsule (30 mg total) by mouth daily.  Costochondritis    Patient Instructions       If you have lab work done today you will be contacted with your lab results within the next 2 weeks.  If you have not heard from [place] then please contact us. The fastest way to get your results is to register for My Chart.   IF you received an x-ray today, you will receive an invoice from [facility]. Please contact [facility] at [phone] with questions or concerns regarding your invoice.   IF you received labwork today, you will receive an invoice from [facility]. Please contact [facility] at [phone] with questions or concerns regarding your invoice.   Our billing staff  will not be able to assist you with questions regarding bills from these companies.  You will be contacted with the lab results as soon as they are available. The fastest way to get your results is to activate your My Chart account. Instructions are located on the last page of this paperwork. If you have not heard from [place] regarding the results in 2 weeks, please contact this office.      Living With Anxiety After being diagnosed with an anxiety disorder,  you may be relieved to know why you have felt or behaved a certain way. It is natural to also feel overwhelmed about the treatment ahead and what it will mean for your life. With care and support, you can manage this condition and recover from it. How to cope with anxiety Dealing with stress Stress is your body's reaction to life changes and events, both good and bad. Stress can last just a few hours or it can be ongoing. Stress can play a major role in anxiety, so it is important to learn both how to cope with stress and how to think about it differently. Talk with your health care provider or a counselor to learn more about stress reduction. He or she may suggest some stress reduction techniques, such as:  Music therapy. This can include creating or listening to music that you enjoy and that inspires you.  Mindfulness-based meditation. This involves being aware of your normal breaths, rather than trying to control your breathing. It can be done while sitting or walking.  Centering prayer. This is a kind of meditation that involves focusing on a word, phrase, or sacred image that is meaningful to you and that brings you peace.  Deep breathing. To do this, expand your stomach and inhale slowly through your nose. Hold your breath for 3-5 seconds. Then exhale slowly, allowing your stomach muscles to relax.  Self-talk. This is a skill where you identify thought patterns that lead to anxiety reactions and correct those thoughts.  Muscle  relaxation. This involves tensing muscles then relaxing them.  Choose a stress reduction technique that fits your lifestyle and personality. Stress reduction techniques take time and practice. Set aside 5-15 minutes a day to do them. Therapists can offer training in these techniques. The training may be covered by some insurance plans. Other things you can do to manage stress include:  Keeping a stress diary. This can help you learn what triggers your stress and ways to control your response.  Thinking about how you respond to certain situations. You may not be able to control everything, but you can control your reaction.  Making time for activities that help you relax, and not feeling guilty about spending your time in this way.  Therapy combined with coping and stress-reduction skills provides the best chance for successful treatment. Medicines Medicines can help ease symptoms. Medicines for anxiety include:  Anti-anxiety drugs.  Antidepressants.  Beta-blockers.  Medicines may be used as the main treatment for anxiety disorder, along with therapy, or if other treatments are not working. Medicines should be prescribed by a health care provider. Relationships Relationships can play a big part in helping you recover. Try to spend more time connecting with trusted friends and family members. Consider going to couples counseling, taking family education classes, or going to family therapy. Therapy can help you and others better understand the condition. How to recognize changes in your condition Everyone has a different response to treatment for anxiety. Recovery from anxiety happens when symptoms decrease and stop interfering with your daily activities at home or work. This may mean that you will start to:  Have better concentration and focus.  Sleep better.  Be less irritable.  Have more energy.  Have improved memory.  It is important to recognize when your condition is getting  worse. Contact your health care provider if your symptoms interfere with home or work and you do not feel like your condition is improving. Where to find help and support: You  can get help and support from these sources:  Self-help groups.  Online and [employer].  A trusted spiritual leader.  Couples counseling.  Family education classes.  Family therapy.  Follow these instructions at home:  Eat a healthy diet that includes plenty of vegetables, fruits, whole grains, low-fat dairy products, and lean protein. Do not eat a lot of foods that are high in solid fats, added sugars, or salt.  Exercise. Most adults should do the following: ? Exercise for at least 150 minutes each week. The exercise should increase your heart rate and make you sweat (moderate-intensity exercise). ? Strengthening exercises at least twice a week.  Cut down on caffeine, tobacco, alcohol, and other potentially harmful substances.  Get the right amount and quality of sleep. Most adults need 7-9 hours of sleep each night.  Make choices that simplify your life.  Take over-the-counter and prescription medicines only as told by your health care provider.  Avoid caffeine, alcohol, and certain over-the-counter cold medicines. These may make you feel worse. Ask your pharmacist which medicines to avoid.  Keep all follow-up visits as told by your health care provider. This is important. Questions to ask your health care provider  Would I benefit from therapy?  How often should I follow up with a health care provider?  How long do I need to take medicine?  Are there any long-term side effects of my medicine?  Are there any alternatives to taking medicine? Contact a health care provider if:  You have a hard time staying focused or finishing daily tasks.  You spend many hours a day feeling worried about everyday life.  You become exhausted by worry.  You start to have headaches, feel tense,  or have nausea.  You urinate more than normal.  You have diarrhea. Get help right away if:  You have a racing heart and shortness of breath.  You have thoughts of hurting yourself or others. If you ever feel like you may hurt yourself or others, or have thoughts about taking your own life, get help right away. You can go to your nearest emergency department or call:  Your local emergency services (911 in the [place]  A suicide crisis helpline, such as the National Suicide Prevention Lifeline at 617-158-9751. This is open 24-hours a day.  Summary  Taking steps to deal with stress can help calm you.  Medicines cannot cure anxiety disorders, but they can help ease symptoms.  Family, friends, and partners can play a big part in helping you recover from an anxiety disorder. This information is not intended to replace advice given to you by your health care provider. Make sure you discuss any questions you have with your health care provider. Document Released: 12/13/2015 Document Revised: 12/13/2015 Document Reviewed: 12/13/2015 Elsevier Interactive Patient Education  2018 [employer] Inc.      Edwina Barth, MD [facility]

## 2018-02-27 ENCOUNTER — Ambulatory Visit: Payer: Managed Care, Other (non HMO) | Admitting: Emergency Medicine

## 2018-03-28 ENCOUNTER — Telehealth: Payer: Managed Care, Other (non HMO) | Admitting: Emergency Medicine

## 2018-05-05 ENCOUNTER — Ambulatory Visit: Payer: Managed Care, Other (non HMO) | Admitting: Emergency Medicine

## 2018-05-06 ENCOUNTER — Telehealth: Payer: Self-pay | Admitting: Emergency Medicine

## 2018-05-06 NOTE — Telephone Encounter (Signed)
05/06/2018 - PATIENT HAD A VISIT SCHEDULED WITH DR. Irving Shows ON Monday 05/05/2018 TO SEE A BUTTERFLY RASH ON HER CHEEKS. SHE WAS A NO SHOW. I TRIED TO CALL AND RESCHEDULE BUT HAD TO LEAVE HER A VOICE MAIL. MBC

## 2018-05-12 ENCOUNTER — Encounter: Payer: Self-pay | Admitting: Emergency Medicine

## 2018-05-12 ENCOUNTER — Ambulatory Visit (INDEPENDENT_AMBULATORY_CARE_PROVIDER_SITE_OTHER): Payer: Managed Care, Other (non HMO) | Admitting: Emergency Medicine

## 2018-05-12 ENCOUNTER — Other Ambulatory Visit: Payer: Self-pay

## 2018-05-12 VITALS — BP 142/90 | HR 76 | Temp 98.6°F | Resp 20 | Ht 58.78 in | Wt 141.4 lb

## 2018-05-12 DIAGNOSIS — R21 Rash and other nonspecific skin eruption: Secondary | ICD-10-CM | POA: Insufficient documentation

## 2018-05-12 NOTE — Progress Notes (Signed)
Tara Alvarado 48 y.o.   Chief Complaint  Patient presents with  . Rash    X 1 mth on face  . SEXUALLY TRANSMITTED DISEASE    pt would like test due to recent positive test done by [facility] 01/21/2018- pt would like labs done today     HISTORY OF PRESENT ILLNESS: This is a 48 y.o. female complaining of malar rash for 1 month.  Last January she had a false positive VDRL/RPR test.  Concerned that it may be associated with some type of chronic autoimmune disease such as lupus. Denies joint pains, fever chills, hair loss.  Seen by me last year with fatigue and malaise.  Has some chronic pain to left hip and thigh area.  Denies any other significant symptoms.  HPI   Prior to Admission medications   Medication Sig Start Date End Date Taking? Authorizing Provider  DULoxetine (CYMBALTA) 30 MG capsule Take 1 capsule (30 mg total) by mouth daily. 10/03/17 11/02/17  Horald Pollen, MD    No Known Allergies  Patient Active Problem List   Diagnosis Date Noted  . Malar rash 05/12/2018    Past Medical History:  Diagnosis Date  . Allergy   . Anxiety     Past Surgical History:  Procedure Laterality Date  . CESAREAN SECTION      Social History   Socioeconomic History  . Marital status: Married    Spouse name: Not on file  . Number of children: 3  . Years of education: Not on file  . Highest education level: Not on file  Occupational History  . Not on file  Social Needs  . Financial resource strain: Not on file  . Food insecurity:    Worry: Not on file    Inability: Not on file  . Transportation needs:    Medical: Not on file    Non-medical: Not on file  Tobacco Use  . Smoking status: Former Research scientist (life sciences)  . Smokeless tobacco: Never Used  Substance and Sexual Activity  . Alcohol use: No  . Drug use: No  . Sexual activity: Not Currently  Lifestyle  . Physical activity:    Days per week: Not on file    Minutes per session: Not on file  . Stress: Not on  file  Relationships  . Social connections:    Talks on phone: Not on file    Gets together: Not on file    Attends religious service: Not on file    Active member of club or organization: Not on file    Attends meetings of clubs or organizations: Not on file    Relationship status: Not on file  . Intimate partner violence:    Fear of current or ex partner: Not on file    Emotionally abused: Not on file    Physically abused: Not on file    Forced sexual activity: Not on file  Other Topics Concern  . Not on file  Social History Narrative  . Not on file    Family History  Problem Relation Age of Onset  . Heart disease Mother 71       AMI/CAD  . Cancer Mother        uterus  . Heart disease Father 74       AMI x 2  . Hyperlipidemia Father   . Cancer Father        kidney  . CAD Other   . Cancer Other  breast  . Heart disease Paternal Grandmother   . Cancer Paternal Grandmother        stomach     Review of Systems  Constitutional: Negative.  Negative for chills and fever.  HENT: Negative.  Negative for congestion and sore throat.   Eyes: Negative.  Negative for blurred vision and double vision.  Respiratory: Negative.  Negative for cough and shortness of breath.   Cardiovascular: Negative.  Negative for chest pain and palpitations.  Gastrointestinal: Negative.  Negative for abdominal pain, diarrhea, nausea and vomiting.  Genitourinary: Negative.  Negative for dysuria.  Musculoskeletal: Negative.  Negative for myalgias.  Skin: Negative.  Negative for rash.  Neurological: Negative.  Negative for dizziness and headaches.  Endo/Heme/Allergies: Negative.   All other systems reviewed and are negative.  Vitals:   05/12/18 1111 05/12/18 1136  BP: (!) 155/89 (!) 142/90  Pulse: 76   Resp: 20   Temp: 98.6 F (37 C)   SpO2: 99%      Physical Exam Vitals signs reviewed.  Constitutional:      Appearance: Normal appearance.  HENT:     Head: Normocephalic and  atraumatic.     Nose: Nose normal.     Mouth/Throat:     Mouth: Mucous membranes are moist.     Pharynx: Oropharynx is clear.  Eyes:     Extraocular Movements: Extraocular movements intact.     Conjunctiva/sclera: Conjunctivae normal.     Pupils: Pupils are equal, round, and reactive to light.  Neck:     Musculoskeletal: Normal range of motion and neck supple.  Cardiovascular:     Rate and Rhythm: Normal rate and regular rhythm.     Pulses: Normal pulses.     Heart sounds: Normal heart sounds.  Pulmonary:     Effort: Pulmonary effort is normal.     Breath sounds: Normal breath sounds.  Musculoskeletal: Normal range of motion.  Lymphadenopathy:     Cervical: No cervical adenopathy.  Skin:    General: Skin is warm and dry.     Capillary Refill: Capillary refill takes less than 2 seconds.  Neurological:     General: No focal deficit present.     Mental Status: She is alert and oriented to person, place, and time.  Psychiatric:        Mood and Affect: Mood normal.        Behavior: Behavior normal.    A total of 25 minutes was spent in the room with the patient, greater than 50% of which was in counseling/coordination of care regarding differential diagnosis including but not limited to lupus, fibromyalgia, RA.  We also discussed need for diagnostic blood work and possible rheumatology referral.  We also spoke about nutrition, physical activity, and stress management.   ASSESSMENT & PLAN: Tara Alvarado was seen today for rash and sexually transmitted disease.  Diagnoses and all orders for this visit:  Malar rash -     CBC with Differential/Platelet -     Sedimentation Rate -     Comprehensive metabolic panel -     ANA,IFA RA Diag Pnl w/rflx Tit/Patn   Patient Instructions       If you have lab work done today you will be contacted with your lab results within the next 2 weeks.  If you have not heard from [place] then please contact us. The fastest way to get your results is to  register for My Chart.   IF you received an x-ray today, you will  receive an Pharmacologist from [facility]. Please contact [facility] at [phone] with questions or concerns regarding your invoice.   IF you received labwork today, you will receive an invoice from [place]. Please contact [facility] at [phone] with questions or concerns regarding your invoice.   Our billing staff will not be able to assist you with questions regarding bills from these companies.  You will be contacted with the lab results as soon as they are available. The fastest way to get your results is to activate your My Chart account. Instructions are located on the last page of this paperwork. If you have not heard from [place] regarding the results in 2 weeks, please contact this office.    Health Maintenance, Female Adopting a healthy lifestyle and getting preventive care can go a long way to promote health and wellness. Talk with your health care provider about what schedule of regular examinations is right for you. This is a good chance for you to check in with your provider about disease prevention and staying healthy. In between checkups, there are plenty of things you can do on your own. Experts have done a lot of research about which lifestyle changes and preventive measures are most likely to keep you healthy. Ask your health care provider for more information. Weight and diet Eat a healthy diet  Be sure to include plenty of vegetables, fruits, low-fat dairy products, and lean protein.  Do not eat a lot of foods high in solid fats, added sugars, or salt.  Get regular exercise. This is one of the most important things you can do for your health. ? Most adults should exercise for at least 150 minutes each week. The exercise should increase your heart rate and make you sweat (moderate-intensity exercise). ? Most adults should also do strengthening exercises at least twice a week. This is in  addition to the moderate-intensity exercise. Maintain a healthy weight  Body mass index (BMI) is a measurement that can be used to identify possible weight problems. It estimates body fat based on height and weight. Your health care provider can help determine your BMI and help you achieve or maintain a healthy weight.  For females 22 years of age and older: ? A BMI below 18.5 is considered underweight. ? A BMI of 18.5 to 24.9 is normal. ? A BMI of 25 to 29.9 is considered overweight. ? A BMI of 30 and above is considered obese. Watch levels of cholesterol and blood lipids  You should start having your blood tested for lipids and cholesterol at 50 years of age, then have this test every 5 years.  You may need to have your cholesterol levels checked more often if: ? Your lipid or cholesterol levels are high. ? You are older than 50 years of age. ? You are at high risk for heart disease. Cancer screening Lung Cancer  Lung cancer screening is recommended for adults 40-23 years old who are at high risk for lung cancer because of a history of smoking.  A yearly low-dose CT scan of the lungs is recommended for people who: ? Currently smoke. ? Have quit within the past 15 years. ? Have at least a 30-pack-year history of smoking. A pack year is smoking an average of one pack of cigarettes a day for 1 year.  Yearly screening should continue until it has been 15 years since you quit.  Yearly screening should stop if you develop a health problem that would prevent you from having  lung cancer treatment. Breast Cancer  Practice breast self-awareness. This means understanding how your breasts normally appear and feel.  It also means doing regular breast self-exams. Let your health care provider know about any changes, no matter how small.  If you are in your 20s or 30s, you should have a clinical breast exam (CBE) by a health care provider every 1-3 years as part of a regular health  exam.  If you are 52 or older, have a CBE every year. Also consider having a breast X-ray (mammogram) every year.  If you have a family history of breast cancer, talk to your health care provider about genetic screening.  If you are at high risk for breast cancer, talk to your health care provider about having an MRI and a mammogram every year.  Breast cancer gene (BRCA) assessment is recommended for women who have family members with BRCA-related cancers. BRCA-related cancers include: ? Breast. ? Ovarian. ? Tubal. ? Peritoneal cancers.  Results of the assessment will determine the need for genetic counseling and BRCA1 and BRCA2 testing. Cervical Cancer Your health care provider may recommend that you be screened regularly for cancer of the pelvic organs (ovaries, uterus, and vagina). This screening involves a pelvic examination, including checking for microscopic changes to the surface of your cervix (Pap test). You may be encouraged to have this screening done every 3 years, beginning at age 81.  For women ages 30-65, health care providers may recommend pelvic exams and Pap testing every 3 years, or they may recommend the Pap and pelvic exam, combined with testing for human papilloma virus (HPV), every 5 years. Some types of HPV increase your risk of cervical cancer. Testing for HPV may also be done on women of any age with unclear Pap test results.  Other health care providers may not recommend any screening for nonpregnant women who are considered low risk for pelvic cancer and who do not have symptoms. Ask your health care provider if a screening pelvic exam is right for you.  If you have had past treatment for cervical cancer or a condition that could lead to cancer, you need Pap tests and screening for cancer for at least 20 years after your treatment. If Pap tests have been discontinued, your risk factors (such as having a new sexual partner) need to be reassessed to determine if  screening should resume. Some women have medical problems that increase the chance of getting cervical cancer. In these cases, your health care provider may recommend more frequent screening and Pap tests. Colorectal Cancer  This type of cancer can be detected and often prevented.  Routine colorectal cancer screening usually begins at 50 years of age and continues through 50 years of age.  Your health care provider may recommend screening at an earlier age if you have risk factors for colon cancer.  Your health care provider may also recommend using home test kits to check for hidden blood in the stool.  A small camera at the end of a tube can be used to examine your colon directly (sigmoidoscopy or colonoscopy). This is done to check for the earliest forms of colorectal cancer.  Routine screening usually begins at age 73.  Direct examination of the colon should be repeated every 5-10 years through 50 years of age. However, you may need to be screened more often if early forms of precancerous polyps or small growths are found. Skin Cancer  Check your skin from head to toe regularly.  Tell  your health care provider about any new moles or changes in moles, especially if there is a change in a mole's shape or color.  Also tell your health care provider if you have a mole that is larger than the size of a pencil eraser.  Always use sunscreen. Apply sunscreen liberally and repeatedly throughout the day.  Protect yourself by wearing long sleeves, pants, a wide-brimmed hat, and sunglasses whenever you are outside. Heart disease, diabetes, and high blood pressure  High blood pressure causes heart disease and increases the risk of stroke. High blood pressure is more likely to develop in: ? People who have blood pressure in the high end of the normal range (130-139/85-89 mm Hg). ? People who are overweight or obese. ? People who are African American.  If you are 14-68 years of age, have your  blood pressure checked every 3-5 years. If you are 68 years of age or older, have your blood pressure checked every year. You should have your blood pressure measured twice-once when you are at a hospital or clinic, and once when you are not at a hospital or clinic. Record the average of the two measurements. To check your blood pressure when you are not at a hospital or clinic, you can use: ? An automated blood pressure machine at a pharmacy. ? A home blood pressure monitor.  If you are between 10 years and 74 years old, ask your health care provider if you should take aspirin to prevent strokes.  Have regular diabetes screenings. This involves taking a blood sample to check your fasting blood sugar level. ? If you are at a normal weight and have a low risk for diabetes, have this test once every three years after 50 years of age. ? If you are overweight and have a high risk for diabetes, consider being tested at a younger age or more often. Preventing infection Hepatitis B  If you have a higher risk for hepatitis B, you should be screened for this virus. You are considered at high risk for hepatitis B if: ? You were born in a country where hepatitis B is common. Ask your health care provider which countries are considered high risk. ? Your parents were born in a high-risk country, and you have not been immunized against hepatitis B (hepatitis B vaccine). ? You have HIV or AIDS. ? You use needles to inject street drugs. ? You live with someone who has hepatitis B. ? You have had sex with someone who has hepatitis B. ? You get hemodialysis treatment. ? You take certain medicines for conditions, including cancer, organ transplantation, and autoimmune conditions. Hepatitis C  Blood testing is recommended for: ? Everyone born from [birth year] through 1967. ? Anyone with known risk factors for hepatitis C. Sexually transmitted infections (STIs)  You should be screened for sexually transmitted  infections (STIs) including gonorrhea and chlamydia if: ? You are sexually active and are younger than 50 years of age. ? You are older than 50 years of age and your health care provider tells you that you are at risk for this type of infection. ? Your sexual activity has changed since you were last screened and you are at an increased risk for chlamydia or gonorrhea. Ask your health care provider if you are at risk.  If you do not have HIV, but are at risk, it may be recommended that you take a prescription medicine daily to prevent HIV infection. This is called pre-exposure prophylaxis (PrEP). You  are considered at risk if: ? You are sexually active and do not regularly use condoms or know the HIV status of your partner(s). ? You take drugs by injection. ? You are sexually active with a partner who has HIV. Talk with your health care provider about whether you are at high risk of being infected with HIV. If you choose to begin PrEP, you should first be tested for HIV. You should then be tested every 3 months for as long as you are taking PrEP. Pregnancy  If you are premenopausal and you may become pregnant, ask your health care provider about preconception counseling.  If you may become pregnant, take 400 to 800 micrograms (mcg) of folic acid every day.  If you want to prevent pregnancy, talk to your health care provider about birth control (contraception). Osteoporosis and menopause  Osteoporosis is a disease in which the bones lose minerals and strength with aging. This can result in serious bone fractures. Your risk for osteoporosis can be identified using a bone density scan.  If you are 67 years of age or older, or if you are at risk for osteoporosis and fractures, ask your health care provider if you should be screened.  Ask your health care provider whether you should take a calcium or vitamin D supplement to lower your risk for osteoporosis.  Menopause may have certain physical  symptoms and risks.  Hormone replacement therapy may reduce some of these symptoms and risks. Talk to your health care provider about whether hormone replacement therapy is right for you. Follow these instructions at home:  Schedule regular health, dental, and eye exams.  Stay current with your immunizations.  Do not use any tobacco products including cigarettes, chewing tobacco, or electronic cigarettes.  If you are pregnant, do not drink alcohol.  If you are breastfeeding, limit how much and how often you drink alcohol.  Limit alcohol intake to no more than 1 drink per day for nonpregnant women. One drink equals 12 ounces of beer, 5 ounces of wine, or 1 ounces of hard liquor.  Do not use street drugs.  Do not share needles.  Ask your health care provider for help if you need support or information about quitting drugs.  Tell your health care provider if you often feel depressed.  Tell your health care provider if you have ever been abused or do not feel safe at home. This information is not intended to replace advice given to you by your health care provider. Make sure you discuss any questions you have with your health care provider. Document Released: 07/03/2010 Document Revised: 05/26/2015 Document Reviewed: 09/21/2014 Elsevier Interactive Patient Education  2019 [employer]      Agustina Caroli, MD [facility]

## 2018-05-12 NOTE — Patient Instructions (Addendum)
If you have lab work done today you will be contacted with your lab results within the next 2 weeks.  If you have not heard from [place] then please contact us. The fastest way to get your results is to register for My Chart.   IF you received an x-ray today, you will receive an invoice from [facility]. Please contact [facility] at [phone] with questions or concerns regarding your invoice.   IF you received labwork today, you will receive an invoice from [place]. Please contact [facility] at [phone] with questions or concerns regarding your invoice.   Our billing staff will not be able to assist you with questions regarding bills from these companies.  You will be contacted with the lab results as soon as they are available. The fastest way to get your results is to activate your My Chart account. Instructions are located on the last page of this paperwork. If you have not heard from [place] regarding the results in 2 weeks, please contact this office.    Health Maintenance, Female Adopting a healthy lifestyle and getting preventive care can go a long way to promote health and wellness. Talk with your health care provider about what schedule of regular examinations is right for you. This is a good chance for you to check in with your provider about disease prevention and staying healthy. In between checkups, there are plenty of things you can do on your own. Experts have done a lot of research about which lifestyle changes and preventive measures are most likely to keep you healthy. Ask your health care provider for more information. Weight and diet Eat a healthy diet  Be sure to include plenty of vegetables, fruits, low-fat dairy products, and lean protein.  Do not eat a lot of foods high in solid fats, added sugars, or salt.  Get regular exercise. This is one of the most important things you can do for your health. ? Most adults should exercise for at least 150  minutes each week. The exercise should increase your heart rate and make you sweat (moderate-intensity exercise). ? Most adults should also do strengthening exercises at least twice a week. This is in addition to the moderate-intensity exercise. Maintain a healthy weight  Body mass index (BMI) is a measurement that can be used to identify possible weight problems. It estimates body fat based on height and weight. Your health care provider can help determine your BMI and help you achieve or maintain a healthy weight.  For females 12 years of age and older: ? A BMI below 18.5 is considered underweight. ? A BMI of 18.5 to 24.9 is normal. ? A BMI of 25 to 29.9 is considered overweight. ? A BMI of 30 and above is considered obese. Watch levels of cholesterol and blood lipids  You should start having your blood tested for lipids and cholesterol at 50 years of age, then have this test every 5 years.  You may need to have your cholesterol levels checked more often if: ? Your lipid or cholesterol levels are high. ? You are older than 50 years of age. ? You are at high risk for heart disease. Cancer screening Lung Cancer  Lung cancer screening is recommended for adults 58-36 years old who are at high risk for lung cancer because of a history of smoking.  A yearly low-dose CT scan of the lungs is recommended for people who: ? Currently smoke. ? Have quit within the past 15  years. ? Have at least a 30-pack-year history of smoking. A pack year is smoking an average of one pack of cigarettes a day for 1 year.  Yearly screening should continue until it has been 15 years since you quit.  Yearly screening should stop if you develop a health problem that would prevent you from having lung cancer treatment. Breast Cancer  Practice breast self-awareness. This means understanding how your breasts normally appear and feel.  It also means doing regular breast self-exams. Let your health care provider  know about any changes, no matter how small.  If you are in your 20s or 30s, you should have a clinical breast exam (CBE) by a health care provider every 1-3 years as part of a regular health exam.  If you are 3 or older, have a CBE every year. Also consider having a breast X-ray (mammogram) every year.  If you have a family history of breast cancer, talk to your health care provider about genetic screening.  If you are at high risk for breast cancer, talk to your health care provider about having an MRI and a mammogram every year.  Breast cancer gene (BRCA) assessment is recommended for women who have family members with BRCA-related cancers. BRCA-related cancers include: ? Breast. ? Ovarian. ? Tubal. ? Peritoneal cancers.  Results of the assessment will determine the need for genetic counseling and BRCA1 and BRCA2 testing. Cervical Cancer Your health care provider may recommend that you be screened regularly for cancer of the pelvic organs (ovaries, uterus, and vagina). This screening involves a pelvic examination, including checking for microscopic changes to the surface of your cervix (Pap test). You may be encouraged to have this screening done every 3 years, beginning at age 63.  For women ages 71-65, health care providers may recommend pelvic exams and Pap testing every 3 years, or they may recommend the Pap and pelvic exam, combined with testing for human papilloma virus (HPV), every 5 years. Some types of HPV increase your risk of cervical cancer. Testing for HPV may also be done on women of any age with unclear Pap test results.  Other health care providers may not recommend any screening for nonpregnant women who are considered low risk for pelvic cancer and who do not have symptoms. Ask your health care provider if a screening pelvic exam is right for you.  If you have had past treatment for cervical cancer or a condition that could lead to cancer, you need Pap tests and  screening for cancer for at least 20 years after your treatment. If Pap tests have been discontinued, your risk factors (such as having a new sexual partner) need to be reassessed to determine if screening should resume. Some women have medical problems that increase the chance of getting cervical cancer. In these cases, your health care provider may recommend more frequent screening and Pap tests. Colorectal Cancer  This type of cancer can be detected and often prevented.  Routine colorectal cancer screening usually begins at 50 years of age and continues through 50 years of age.  Your health care provider may recommend screening at an earlier age if you have risk factors for colon cancer.  Your health care provider may also recommend using home test kits to check for hidden blood in the stool.  A small camera at the end of a tube can be used to examine your colon directly (sigmoidoscopy or colonoscopy). This is done to check for the earliest forms of colorectal cancer.  Routine screening usually begins at age 50.  Direct examination of the colon should be repeated every 5-10 years through 50 years of age. However, you may need to be screened more often if early forms of precancerous polyps or small growths are found. Skin Cancer  Check your skin from head to toe regularly.  Tell your health care provider about any new moles or changes in moles, especially if there is a change in a mole's shape or color.  Also tell your health care provider if you have a mole that is larger than the size of a pencil eraser.  Always use sunscreen. Apply sunscreen liberally and repeatedly throughout the day.  Protect yourself by wearing long sleeves, pants, a wide-brimmed hat, and sunglasses whenever you are outside. Heart disease, diabetes, and high blood pressure  High blood pressure causes heart disease and increases the risk of stroke. High blood pressure is more likely to develop in: ? People who  have blood pressure in the high end of the normal range (130-139/85-89 mm Hg). ? People who are overweight or obese. ? People who are African American.  If you are 18-39 years of age, have your blood pressure checked every 3-5 years. If you are 40 years of age or older, have your blood pressure checked every year. You should have your blood pressure measured twice-once when you are at a hospital or clinic, and once when you are not at a hospital or clinic. Record the average of the two measurements. To check your blood pressure when you are not at a hospital or clinic, you can use: ? An automated blood pressure machine at a pharmacy. ? A home blood pressure monitor.  If you are between 55 years and 79 years old, ask your health care provider if you should take aspirin to prevent strokes.  Have regular diabetes screenings. This involves taking a blood sample to check your fasting blood sugar level. ? If you are at a normal weight and have a low risk for diabetes, have this test once every three years after 50 years of age. ? If you are overweight and have a high risk for diabetes, consider being tested at a younger age or more often. Preventing infection Hepatitis B  If you have a higher risk for hepatitis B, you should be screened for this virus. You are considered at high risk for hepatitis B if: ? You were born in a country where hepatitis B is common. Ask your health care provider which countries are considered high risk. ? Your parents were born in a high-risk country, and you have not been immunized against hepatitis B (hepatitis B vaccine). ? You have HIV or AIDS. ? You use needles to inject street drugs. ? You live with someone who has hepatitis B. ? You have had sex with someone who has hepatitis B. ? You get hemodialysis treatment. ? You take certain medicines for conditions, including cancer, organ transplantation, and autoimmune conditions. Hepatitis C  Blood testing is  recommended for: ? Everyone born from 1945 through 1965. ? Anyone with known risk factors for hepatitis C. Sexually transmitted infections (STIs)  You should be screened for sexually transmitted infections (STIs) including gonorrhea and chlamydia if: ? You are sexually active and are younger than 50 years of age. ? You are older than 50 years of age and your health care provider tells you that you are at risk for this type of infection. ? Your sexual activity has changed since you   were last screened and you are at an increased risk for chlamydia or gonorrhea. Ask your health care provider if you are at risk.  If you do not have HIV, but are at risk, it may be recommended that you take a prescription medicine daily to prevent HIV infection. This is called pre-exposure prophylaxis (PrEP). You are considered at risk if: ? You are sexually active and do not regularly use condoms or know the HIV status of your partner(s). ? You take drugs by injection. ? You are sexually active with a partner who has HIV. Talk with your health care provider about whether you are at high risk of being infected with HIV. If you choose to begin PrEP, you should first be tested for HIV. You should then be tested every 3 months for as long as you are taking PrEP. Pregnancy  If you are premenopausal and you may become pregnant, ask your health care provider about preconception counseling.  If you may become pregnant, take 400 to 800 micrograms (mcg) of folic acid every day.  If you want to prevent pregnancy, talk to your health care provider about birth control (contraception). Osteoporosis and menopause  Osteoporosis is a disease in which the bones lose minerals and strength with aging. This can result in serious bone fractures. Your risk for osteoporosis can be identified using a bone density scan.  If you are 65 years of age or older, or if you are at risk for osteoporosis and fractures, ask your health care  provider if you should be screened.  Ask your health care provider whether you should take a calcium or vitamin D supplement to lower your risk for osteoporosis.  Menopause may have certain physical symptoms and risks.  Hormone replacement therapy may reduce some of these symptoms and risks. Talk to your health care provider about whether hormone replacement therapy is right for you. Follow these instructions at home:  Schedule regular health, dental, and eye exams.  Stay current with your immunizations.  Do not use any tobacco products including cigarettes, chewing tobacco, or electronic cigarettes.  If you are pregnant, do not drink alcohol.  If you are breastfeeding, limit how much and how often you drink alcohol.  Limit alcohol intake to no more than 1 drink per day for nonpregnant women. One drink equals 12 ounces of beer, 5 ounces of wine, or 1 ounces of hard liquor.  Do not use street drugs.  Do not share needles.  Ask your health care provider for help if you need support or information about quitting drugs.  Tell your health care provider if you often feel depressed.  Tell your health care provider if you have ever been abused or do not feel safe at home. This information is not intended to replace advice given to you by your health care provider. Make sure you discuss any questions you have with your health care provider. Document Released: 07/03/2010 Document Revised: 05/26/2015 Document Reviewed: 09/21/2014 Elsevier Interactive Patient Education  2019 [employer]  

## 2018-05-15 LAB — COMPREHENSIVE METABOLIC PANEL
ALT: 23 IU/L (ref 0–32)
AST: 21 IU/L (ref 0–40)
Albumin/Globulin Ratio: 1.7 (ref 1.2–2.2)
Albumin: 4.5 g/dL (ref 3.8–4.8)
Alkaline Phosphatase: 102 IU/L (ref 39–117)
BUN/Creatinine Ratio: 10 (ref 9–23)
BUN: 7 mg/dL (ref 6–24)
Bilirubin Total: 0.5 mg/dL (ref 0.0–1.2)
CO2: 23 mmol/L (ref 20–29)
Calcium: 9.8 mg/dL (ref 8.7–10.2)
Chloride: 100 mmol/L (ref 96–106)
Creatinine, Ser: 0.68 mg/dL (ref 0.57–1.00)
GFR calc Af Amer: 118 mL/min/{1.73_m2} (ref >59)
GFR calc non Af Amer: 102 mL/min/{1.73_m2} (ref >59)
Globulin, Total: 2.7 g/dL (ref 1.5–4.5)
Glucose: 79 mg/dL (ref 65–99)
Potassium: 4.5 mmol/L (ref 3.5–5.2)
Sodium: 137 mmol/L (ref 134–144)
Total Protein: 7.2 g/dL (ref 6.0–8.5)

## 2018-05-15 LAB — CBC WITH DIFFERENTIAL/PLATELET
Basophils Absolute: 0 10*3/uL (ref 0.0–0.2)
Basos: 1 %
EOS (ABSOLUTE): 0.3 10*3/uL (ref 0.0–0.4)
Eos: 5 %
Hematocrit: 41.6 % (ref 34.0–46.6)
Hemoglobin: 14 g/dL (ref 11.1–15.9)
Immature Grans (Abs): 0 10*3/uL (ref 0.0–0.1)
Immature Granulocytes: 0 %
Lymphocytes Absolute: 0.9 10*3/uL (ref 0.7–3.1)
Lymphs: 16 %
MCH: 28.9 pg (ref 26.6–33.0)
MCHC: 33.7 g/dL (ref 31.5–35.7)
MCV: 86 fL (ref 79–97)
Monocytes Absolute: 0.5 10*3/uL (ref 0.1–0.9)
Monocytes: 9 %
Neutrophils Absolute: 3.7 10*3/uL (ref 1.4–7.0)
Neutrophils: 69 %
Platelets: 254 10*3/uL (ref 150–450)
RBC: 4.85 x10E6/uL (ref 3.77–5.28)
RDW: 13 % (ref 11.7–15.4)
WBC: 5.4 10*3/uL (ref 3.4–10.8)

## 2018-05-15 LAB — ANA,IFA RA DIAG PNL W/RFLX TIT/PATN
ANA Titer 1: NEGATIVE
Cyclic Citrullin Peptide Ab: 8 units (ref 0–19)
Rhuematoid fact SerPl-aCnc: <10 IU/mL (ref 0.0–13.9)

## 2018-05-15 LAB — SEDIMENTATION RATE: Sed Rate: 18 mm/hr (ref 0–40)

## 2018-05-15 NOTE — Telephone Encounter (Signed)
Blood results discussed with patient. 

## 2018-05-22 ENCOUNTER — Encounter: Payer: Self-pay | Admitting: Emergency Medicine

## 2018-05-23 NOTE — Telephone Encounter (Signed)
Good morning Dr. Alvy Bimler,   Tara Alvarado is still having pain with the rash that was recently evaluated and is asking if anything else can be done.  Please see her message below.   Thank you,  Doyne Keel

## 2018-05-26 ENCOUNTER — Other Ambulatory Visit: Payer: Self-pay | Admitting: Emergency Medicine

## 2018-05-26 DIAGNOSIS — R21 Rash and other nonspecific skin eruption: Secondary | ICD-10-CM

## 2018-05-26 MED ORDER — METRONIDAZOLE 0.75 % EX GEL: 1.0000 "application " | Freq: Two times a day (BID) | CUTANEOUS | 0 refills | Status: DC

## 2018-05-26 NOTE — Progress Notes (Signed)
.  derm

## 2018-12-29 IMAGING — DX DG CHEST 2V
2 series · 2 of 2 positions shown · non-contrast
Comparison: Prior chest x-ray 04/25/2013

CLINICAL DATA: 46-year-old female undergoing routine general
medical evaluation

EXAM:
CHEST  2 VIEW

[chest pa]
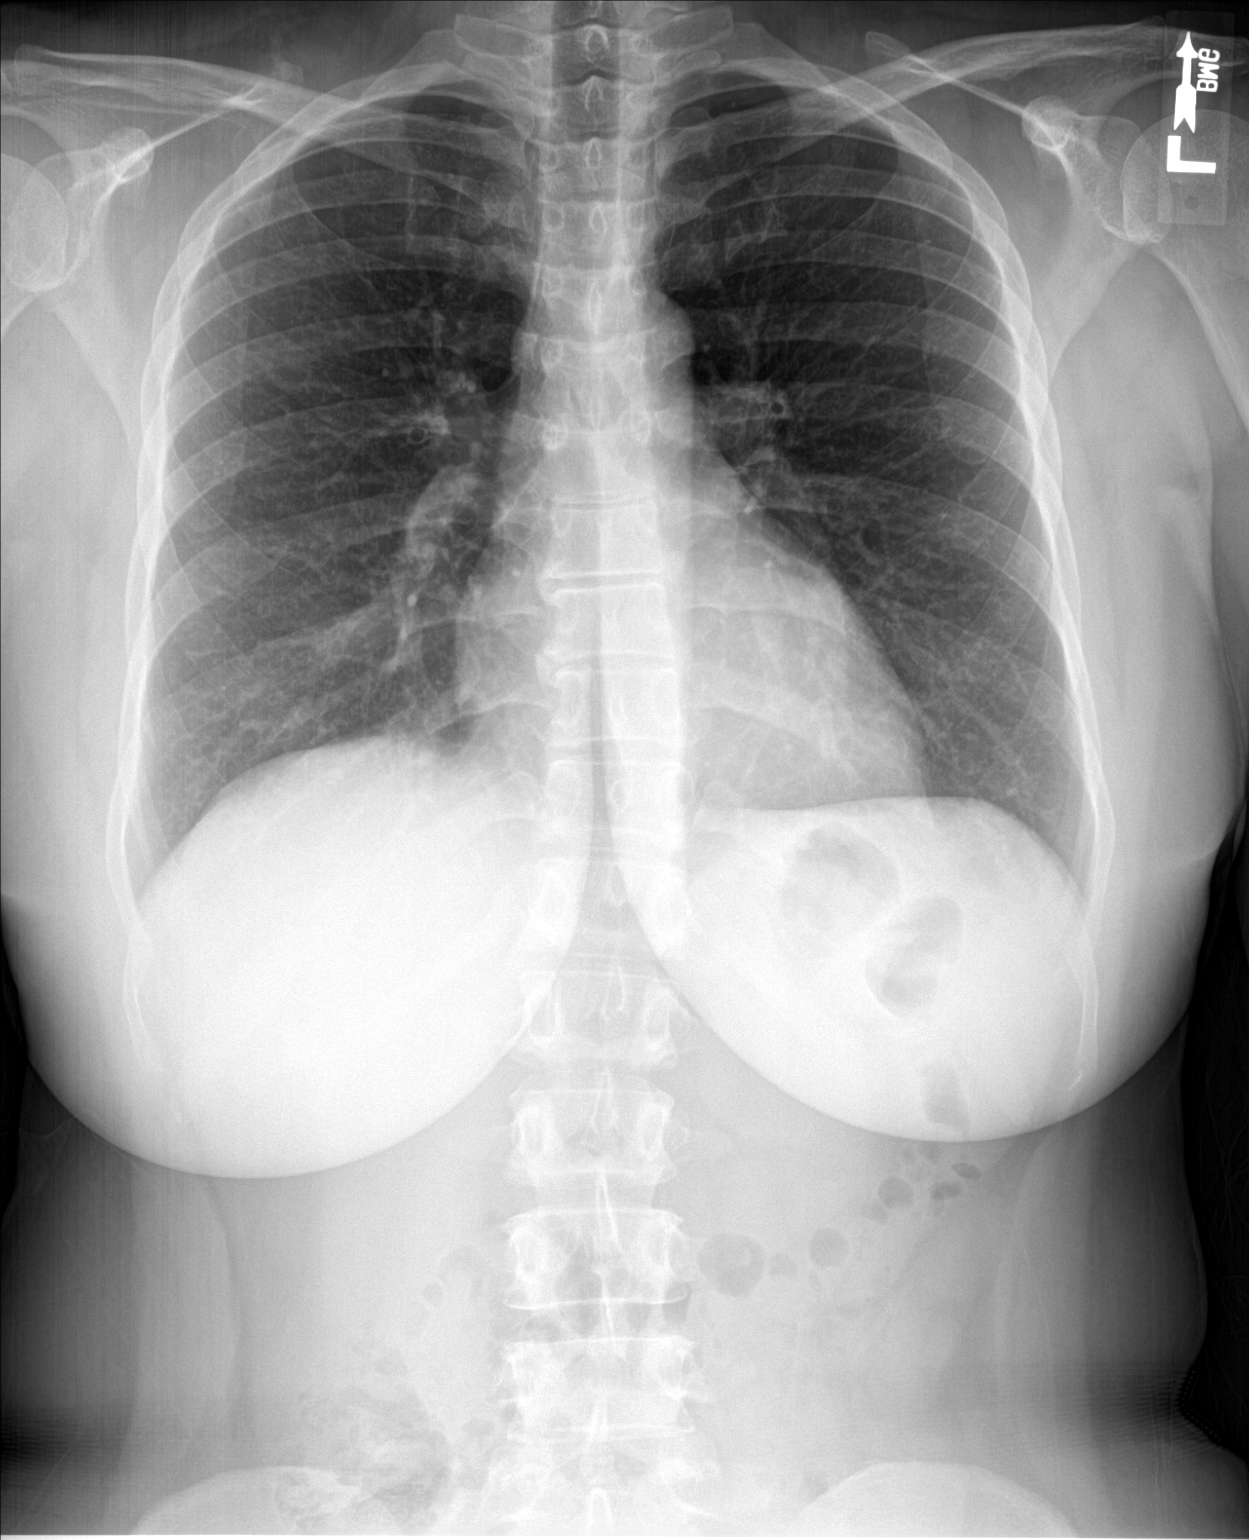

[chest lat]
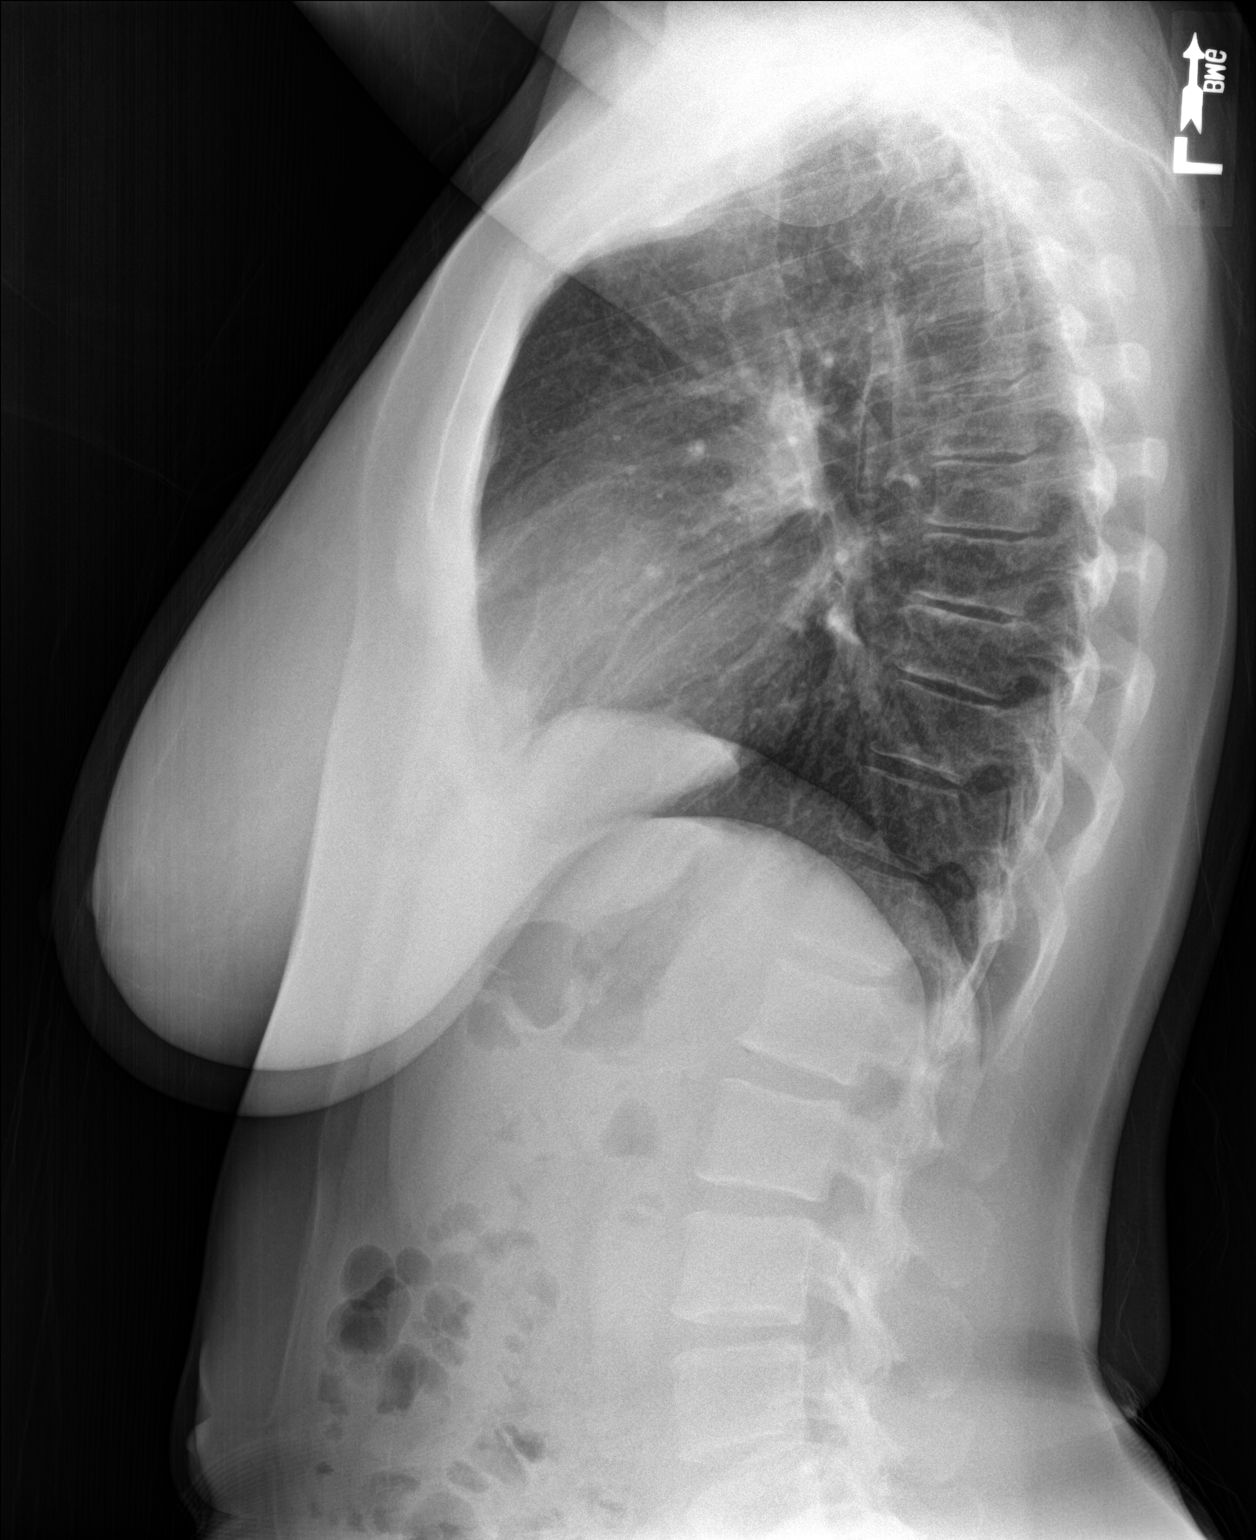

[2 of 2 positions shown; findings below may reference images not displayed]

FINDINGS: The lungs are clear and negative for focal airspace consolidation,
pulmonary edema or suspicious pulmonary nodule. No pleural effusion
or pneumothorax. Cardiac and mediastinal contours are within normal
limits. No acute fracture or lytic or blastic osseous lesions. The
visualized upper abdominal bowel gas pattern is unremarkable.
IMPRESSION: Normal chest x-ray.

## 2019-05-07 ENCOUNTER — Telehealth: Payer: Self-pay | Admitting: Emergency Medicine

## 2019-05-07 NOTE — Telephone Encounter (Signed)
Please order  fasting labs for CPE on 06/22/202 pt is on nurses schedule for 06/18/2019

## 2019-05-07 NOTE — Telephone Encounter (Signed)
Please place fasting labs for the pt for upcoming appt.

## 2019-05-13 ENCOUNTER — Other Ambulatory Visit: Payer: Self-pay | Admitting: *Deleted

## 2019-05-13 DIAGNOSIS — Z13228 Encounter for screening for other metabolic disorders: Secondary | ICD-10-CM

## 2019-05-13 DIAGNOSIS — Z13220 Encounter for screening for lipoid disorders: Secondary | ICD-10-CM

## 2019-05-13 DIAGNOSIS — Z131 Encounter for screening for diabetes mellitus: Secondary | ICD-10-CM

## 2019-05-13 DIAGNOSIS — Z130 Encounter for screening for diseases of the blood and blood-forming organs and certain disorders involving the immune mechanism: Secondary | ICD-10-CM

## 2019-05-13 NOTE — Telephone Encounter (Signed)
Future labs pending for lab visit scheduled 06/18/2019 prior to CPE on 06/23/2019.

## 2019-06-18 ENCOUNTER — Ambulatory Visit: Payer: Managed Care, Other (non HMO)

## 2019-06-23 ENCOUNTER — Encounter: Payer: Managed Care, Other (non HMO) | Admitting: Emergency Medicine

## 2019-07-02 ENCOUNTER — Ambulatory Visit: Payer: Managed Care, Other (non HMO)

## 2019-09-03 ENCOUNTER — Ambulatory Visit (INDEPENDENT_AMBULATORY_CARE_PROVIDER_SITE_OTHER): Payer: Managed Care, Other (non HMO) | Admitting: Emergency Medicine

## 2019-09-03 ENCOUNTER — Encounter: Payer: Self-pay | Admitting: Emergency Medicine

## 2019-09-03 ENCOUNTER — Other Ambulatory Visit: Payer: Self-pay

## 2019-09-03 VITALS — BP 140/84 | HR 77 | Temp 98.9°F | Ht <= 58 in | Wt 141.0 lb

## 2019-09-03 DIAGNOSIS — Z13228 Encounter for screening for other metabolic disorders: Secondary | ICD-10-CM

## 2019-09-03 DIAGNOSIS — Z1329 Encounter for screening for other suspected endocrine disorder: Secondary | ICD-10-CM

## 2019-09-03 DIAGNOSIS — Z1231 Encounter for screening mammogram for malignant neoplasm of breast: Secondary | ICD-10-CM | POA: Diagnosis not present

## 2019-09-03 DIAGNOSIS — M79605 Pain in left leg: Secondary | ICD-10-CM

## 2019-09-03 DIAGNOSIS — Z1211 Encounter for screening for malignant neoplasm of colon: Secondary | ICD-10-CM

## 2019-09-03 DIAGNOSIS — Z20822 Contact with and (suspected) exposure to covid-19: Secondary | ICD-10-CM | POA: Diagnosis not present

## 2019-09-03 DIAGNOSIS — Z13220 Encounter for screening for lipoid disorders: Secondary | ICD-10-CM

## 2019-09-03 DIAGNOSIS — M25552 Pain in left hip: Secondary | ICD-10-CM

## 2019-09-03 DIAGNOSIS — Z130 Encounter for screening for diseases of the blood and blood-forming organs and certain disorders involving the immune mechanism: Secondary | ICD-10-CM

## 2019-09-03 DIAGNOSIS — Z1321 Encounter for screening for nutritional disorder: Secondary | ICD-10-CM

## 2019-09-03 NOTE — Patient Instructions (Signed)
Hip Pain The hip is the joint between the upper legs and the lower pelvis. The bones, cartilage, tendons, and muscles of your hip joint support your body and allow you to move around. Hip pain can range from a minor ache to severe pain in one or both of your hips. The pain may be felt on the inside of the hip joint near the groin, or on the outside near the buttocks and upper thigh. You may also have swelling or stiffness in your hip area. Follow these instructions at home: Managing pain, stiffness, and swelling      If directed, put ice on the painful area. To do this: ? Put ice in a plastic bag. ? Place a towel between your skin and the bag. ? Leave the ice on for 20 minutes, 2-3 times a day.  If directed, apply heat to the affected area as often as told by your health care provider. Use the heat source that your health care provider recommends, such as a moist heat pack or a heating pad. ? Place a towel between your skin and the heat source. ? Leave the heat on for 20-30 minutes. ? Remove the heat if your skin turns bright red. This is especially important if you are unable to feel pain, heat, or cold. You may have a greater risk of getting burned. Activity  Do exercises as told by your health care provider.  Avoid activities that cause pain. General instructions   Take over-the-counter and prescription medicines only as told by your health care provider.  Keep a journal of your symptoms. Write down: ? How often you have hip pain. ? The location of your pain. ? What the pain feels like. ? What makes the pain worse.  Sleep with a pillow between your legs on your most comfortable side.  Keep all follow-up visits as told by your health care provider. This is important. Contact a health care provider if:  You cannot put weight on your leg.  Your pain or swelling continues or gets worse after one week.  It gets harder to walk.  You have a fever. Get help right away  if:  You fall.  You have a sudden increase in pain and swelling in your hip.  Your hip is red or swollen or very tender to touch. Summary  Hip pain can range from a minor ache to severe pain in one or both of your hips.  The pain may be felt on the inside of the hip joint near the groin, or on the outside near the buttocks and upper thigh.  Avoid activities that cause pain.  Write down how often you have hip pain, the location of the pain, what makes it worse, and what it feels like. This information is not intended to replace advice given to you by your health care provider. Make sure you discuss any questions you have with your health care provider. Document Revised: 05/05/2018 Document Reviewed: 05/05/2018 Elsevier Patient Education  2020 [employer] -- 

## 2019-09-03 NOTE — Progress Notes (Signed)
Tiffany Kocher 49 y.o.   Chief Complaint  Patient presents with  . Leg Pain    left leg pain for a while but worse in the last year now.  . Mass    lump on the left forearm for 8 months lump is getting bigger    HISTORY OF PRESENT ILLNESS: This is a 50 y.o. female complaining of pain to her left hip and left leg on and off for at least a year. Also has small lump on left forearm for the past 8 months slowly getting bigger. No other complaints or medical concerns today. Not vaccinated against Covid.  Covid vaccine information provided to patient.  HPI   Prior to Admission medications   Not on File    No Known Allergies  There are no problems to display for this patient.   Past Medical History:  Diagnosis Date  . Allergy   . Anxiety     Past Surgical History:  Procedure Laterality Date  . CESAREAN SECTION      Social History   Socioeconomic History  . Marital status: Married    Spouse name: Not on file  . Number of children: 3  . Years of education: Not on file  . Highest education level: Not on file  Occupational History  . Not on file  Tobacco Use  . Smoking status: Former Games developer  . Smokeless tobacco: Never Used  Vaping Use  . Vaping Use: Never used  Substance and Sexual Activity  . Alcohol use: No  . Drug use: No  . Sexual activity: Not Currently  Other Topics Concern  . Not on file  Social History Narrative  . Not on file   Social Determinants of Health   Financial Resource Strain:   . Difficulty of Paying Living Expenses: Not on file  Food Insecurity:   . Worried About Programme researcher, broadcasting/film/video in the Last Year: Not on file  . Ran Out of Food in the Last Year: Not on file  Transportation Needs:   . Lack of Transportation (Medical): Not on file  . Lack of Transportation (Non-Medical): Not on file  Physical Activity:   . Days of Exercise per Week: Not on file  . Minutes of Exercise per Session: Not on file  Stress:   . Feeling of Stress : Not  on file  Social Connections:   . Frequency of Communication with Friends and Family: Not on file  . Frequency of Social Gatherings with Friends and Family: Not on file  . Attends Religious Services: Not on file  . Active Member of Clubs or Organizations: Not on file  . Attends Banker Meetings: Not on file  . Marital Status: Not on file  Intimate Partner Violence:   . Fear of Current or Ex-Partner: Not on file  . Emotionally Abused: Not on file  . Physically Abused: Not on file  . Sexually Abused: Not on file    Family History  Problem Relation Age of Onset  . Heart disease Mother 26       AMI/CAD  . Cancer Mother        uterus  . Heart disease Father 91       AMI x 2  . Hyperlipidemia Father   . Cancer Father        kidney  . CAD Other   . Cancer Other        breast  . Heart disease Paternal Grandmother   . Cancer Paternal  Grandmother        stomach     Review of Systems  Constitutional: Negative.  Negative for chills and fever.  HENT: Negative.  Negative for congestion and sore throat.   Respiratory: Negative.  Negative for cough and shortness of breath.   Cardiovascular: Negative.  Negative for chest pain and palpitations.  Gastrointestinal: Negative.  Negative for abdominal pain, diarrhea, nausea and vomiting.  Genitourinary: Negative.  Negative for hematuria.  Skin: Negative.  Negative for rash.  Neurological: Negative for dizziness and headaches.  All other systems reviewed and are negative.  Today's Vitals   09/03/19 1017  BP: 140/84  Pulse: 77  Temp: 98.9 F (37.2 C)  TempSrc: Temporal  SpO2: 97%  Weight: 141 lb (64 kg)  Height: 4\' 9"  (1.448 m)   Body mass index is 30.51 kg/m.   Physical Exam Vitals reviewed.  Constitutional:      Appearance: Normal appearance.  HENT:     Head: Normocephalic.  Eyes:     Extraocular Movements: Extraocular movements intact.     Pupils: Pupils are equal, round, and reactive to light.    Cardiovascular:     Rate and Rhythm: Normal rate and regular rhythm.     Pulses: Normal pulses.     Heart sounds: Normal heart sounds.  Pulmonary:     Effort: Pulmonary effort is normal.     Breath sounds: Normal breath sounds.  Musculoskeletal:     Cervical back: Normal range of motion and neck supple.     Comments: Left hip: Mild tenderness to deep palpation.  Full range of motion. Left lower extremity: Warm to touch.  No erythema or ecchymosis.  No tenderness or swelling.  Full range of motion.  Several small superficial veins on distal thigh area slightly tender to palpation without erythema.  Skin:    General: Skin is warm and dry.  Neurological:     General: No focal deficit present.     Mental Status: She is alert and oriented to person, place, and time.  Psychiatric:        Mood and Affect: Mood normal.        Behavior: Behavior normal.      ASSESSMENT & PLAN: Elisandra was seen today for leg pain and mass.  Diagnoses and all orders for this visit:  Left leg pain -     Comprehensive metabolic panel -     CBC with Differential/Platelet -     Vitamin B12 -     VAS Porfirio Mylar LOWER EXTREMITY VENOUS (DVT); Future  Breast cancer screening by mammogram -     MM Digital Screening  Colon cancer screening -     Ambulatory referral to Gastroenterology  Exposure to COVID-19 virus -     SAR CoV2 Serology (COVID 19)AB(IGG)IA  Left hip pain -     VITAMIN D 25 Hydroxy (Vit-D Deficiency, Fractures)  Screening for endocrine, nutritional, metabolic and immunity disorder -     Hemoglobin A1c  Screening for lipoid disorders -     Lipid panel    Patient Instructions  Hip Pain The hip is the joint between the upper legs and the lower pelvis. The bones, cartilage, tendons, and muscles of your hip joint support your body and allow you to move around. Hip pain can range from a minor ache to severe pain in one or both of your hips. The pain may be felt on the inside of the hip joint  near the groin, or on  the outside near the buttocks and upper thigh. You may also have swelling or stiffness in your hip area. Follow these instructions at home: Managing pain, stiffness, and swelling      If directed, put ice on the painful area. To do this: ? Put ice in a plastic bag. ? Place a towel between your skin and the bag. ? Leave the ice on for 20 minutes, 2-3 times a day.  If directed, apply heat to the affected area as often as told by your health care provider. Use the heat source that your health care provider recommends, such as a moist heat pack or a heating pad. ? Place a towel between your skin and the heat source. ? Leave the heat on for 20-30 minutes. ? Remove the heat if your skin turns bright red. This is especially important if you are unable to feel pain, heat, or cold. You may have a greater risk of getting burned. Activity  Do exercises as told by your health care provider.  Avoid activities that cause pain. General instructions   Take over-the-counter and prescription medicines only as told by your health care provider.  Keep a journal of your symptoms. Write down: ? How often you have hip pain. ? The location of your pain. ? What the pain feels like. ? What makes the pain worse.  Sleep with a pillow between your legs on your most comfortable side.  Keep all follow-up visits as told by your health care provider. This is important. Contact a health care provider if:  You cannot put weight on your leg.  Your pain or swelling continues or gets worse after one week.  It gets harder to walk.  You have a fever. Get help right away if:  You fall.  You have a sudden increase in pain and swelling in your hip.  Your hip is red or swollen or very tender to touch. Summary  Hip pain can range from a minor ache to severe pain in one or both of your hips.  The pain may be felt on the inside of the hip joint near the groin, or on the outside near the  buttocks and upper thigh.  Avoid activities that cause pain.  Write down how often you have hip pain, the location of the pain, what makes it worse, and what it feels like. This information is not intended to replace advice given to you by your health care provider. Make sure you discuss any questions you have with your health care provider. Document Revised: 05/05/2018 Document Reviewed: 05/05/2018 Elsevier Patient Education  2020 Elsevier Inc.      Edwina Barth, MD [facility]

## 2019-09-04 LAB — COMPREHENSIVE METABOLIC PANEL
ALT: 25 IU/L (ref 0–32)
AST: 19 IU/L (ref 0–40)
Albumin/Globulin Ratio: 1.8 (ref 1.2–2.2)
Albumin: 4.9 g/dL (ref 3.8–4.9)
Alkaline Phosphatase: 99 IU/L (ref 48–121)
BUN/Creatinine Ratio: 13 (ref 9–23)
BUN: 11 mg/dL (ref 6–24)
Bilirubin Total: 0.7 mg/dL (ref 0.0–1.2)
CO2: 27 mmol/L (ref 20–29)
Calcium: 10.2 mg/dL (ref 8.7–10.2)
Chloride: 101 mmol/L (ref 96–106)
Creatinine, Ser: 0.83 mg/dL (ref 0.57–1.00)
GFR calc Af Amer: 94 mL/min/{1.73_m2} (ref >59)
GFR calc non Af Amer: 82 mL/min/{1.73_m2} (ref >59)
Globulin, Total: 2.7 g/dL (ref 1.5–4.5)
Glucose: 87 mg/dL (ref 65–99)
Potassium: 4.7 mmol/L (ref 3.5–5.2)
Sodium: 139 mmol/L (ref 134–144)
Total Protein: 7.6 g/dL (ref 6.0–8.5)

## 2019-09-04 LAB — CBC WITH DIFFERENTIAL/PLATELET
Basophils Absolute: 0 10*3/uL (ref 0.0–0.2)
Basos: 1 %
EOS (ABSOLUTE): 0.1 10*3/uL (ref 0.0–0.4)
Eos: 3 %
Hematocrit: 44.6 % (ref 34.0–46.6)
Hemoglobin: 14.5 g/dL (ref 11.1–15.9)
Immature Grans (Abs): 0 10*3/uL (ref 0.0–0.1)
Immature Granulocytes: 0 %
Lymphocytes Absolute: 0.8 10*3/uL (ref 0.7–3.1)
Lymphs: 22 %
MCH: 28.4 pg (ref 26.6–33.0)
MCHC: 32.5 g/dL (ref 31.5–35.7)
MCV: 87 fL (ref 79–97)
Monocytes Absolute: 0.3 10*3/uL (ref 0.1–0.9)
Monocytes: 9 %
Neutrophils Absolute: 2.4 10*3/uL (ref 1.4–7.0)
Neutrophils: 65 %
Platelets: 261 10*3/uL (ref 150–450)
RBC: 5.11 x10E6/uL (ref 3.77–5.28)
RDW: 12.2 % (ref 11.7–15.4)
WBC: 3.8 10*3/uL (ref 3.4–10.8)

## 2019-09-04 LAB — LIPID PANEL
Chol/HDL Ratio: 3.7 ratio (ref 0.0–4.4)
Cholesterol, Total: 255 mg/dL — ABNORMAL HIGH (ref 100–199)
HDL: 69 mg/dL (ref >39)
LDL Chol Calc (NIH): 173 mg/dL — ABNORMAL HIGH (ref 0–99)
Triglycerides: 77 mg/dL (ref 0–149)
VLDL Cholesterol Cal: 13 mg/dL (ref 5–40)

## 2019-09-04 LAB — HEMOGLOBIN A1C
Est. average glucose Bld gHb Est-mCnc: 105 mg/dL
Hgb A1c MFr Bld: 5.3 % (ref 4.8–5.6)

## 2019-09-04 LAB — VITAMIN B12: Vitamin B-12: 1528 pg/mL — ABNORMAL HIGH (ref 232–1245)

## 2019-09-04 LAB — VITAMIN D 25 HYDROXY (VIT D DEFICIENCY, FRACTURES): Vit D, 25-Hydroxy: 14.8 ng/mL — ABNORMAL LOW (ref 30.0–100.0)

## 2019-09-04 LAB — SAR COV2 SEROLOGY (COVID19)AB(IGG),IA: DiaSorin SARS-CoV-2 Ab, IgG: NEGATIVE

## 2019-09-21 ENCOUNTER — Encounter: Payer: Self-pay | Admitting: Gastroenterology

## 2019-10-27 ENCOUNTER — Other Ambulatory Visit: Payer: Self-pay

## 2019-10-27 ENCOUNTER — Ambulatory Visit (AMBULATORY_SURGERY_CENTER): Payer: Self-pay

## 2019-10-27 VITALS — Ht <= 58 in | Wt 139.2 lb

## 2019-10-27 DIAGNOSIS — Z1211 Encounter for screening for malignant neoplasm of colon: Secondary | ICD-10-CM

## 2019-10-27 MED ORDER — SUTAB 1479-225-188 MG PO TABS: 12.0000 | ORAL_TABLET | ORAL | 0 refills | Status: DC

## 2019-10-27 NOTE — Progress Notes (Signed)
No allergies to soy or egg Pt is not on blood thinners or diet pills Denies issues with sedation/intubation Denies atrial flutter/fib Denies constipation   Emmi instructions given to pt  Pt is aware of Covid safety and care partner requirements.  

## 2019-10-28 ENCOUNTER — Encounter: Payer: Self-pay | Admitting: Gastroenterology

## 2019-11-10 ENCOUNTER — Ambulatory Visit (AMBULATORY_SURGERY_CENTER): Payer: Self-pay | Admitting: Gastroenterology

## 2019-11-10 ENCOUNTER — Telehealth: Payer: Self-pay | Admitting: *Deleted

## 2019-11-10 ENCOUNTER — Telehealth: Payer: Self-pay | Admitting: Gastroenterology

## 2019-11-10 ENCOUNTER — Encounter: Payer: Self-pay | Admitting: Gastroenterology

## 2019-11-10 ENCOUNTER — Other Ambulatory Visit: Payer: Self-pay

## 2019-11-10 VITALS — BP 144/89 | HR 66 | Temp 97.5°F | Ht <= 58 in | Wt 139.0 lb

## 2019-11-10 DIAGNOSIS — Z1211 Encounter for screening for malignant neoplasm of colon: Secondary | ICD-10-CM

## 2019-11-10 MED ORDER — SODIUM CHLORIDE 0.9 % IV SOLN: 500.0000 mL | Freq: Once | INTRAVENOUS | Status: DC

## 2019-11-10 NOTE — Progress Notes (Signed)
AR - Check-in  VS - CW    Pt's states no medical or surgical changes since previsit or office visit.

## 2019-11-10 NOTE — Progress Notes (Addendum)
I was in Caren Griffins, RN chart in error.  She did not review pt's history, I did . Maw  AR- Check-in  CW - VS Pt's states no medical or surgical changes since previsit or office visit.   Pt took 12 Sutab last pm and had N & V x1.  Her husband called on call md and was advised to d/c Sutab for am dose.  To take 2 bots of Mag Citrate.  After drinking Mag Citrate x2  this am pt had N&V x2.  She is reporting dark brown liquid as last results.  She said it feels like liquid.  I spoke with Dr. Lavon Paganini, she said it does not sound like the pt is cleaned out.  Lets cancel today and bring pt back for a pre-visit and pt take a 1 1/2 day prep.  Use Miralax.  I spoke with pt and explained that if her colon is not cleaned out adaquately, that we would have to abort the exam and bring her back and reschedule colon for another date.  Pt said she would rather cancel today's exam and reschedule.  Caren Griffins, RN helped arrange the appointments for pre-visit and colonscopy.  Pt was satisfied with this arrangement. maw

## 2019-11-10 NOTE — Telephone Encounter (Signed)
Will call patient when pre-visit schedule is available for January to make virtual pre-visit appt  for up-coming colonoscopy 01/14/2020 with Dr Lavon Paganini.  2 day Miralax prep

## 2019-11-10 NOTE — Telephone Encounter (Signed)
Last evening patient vomited about 1 hour after finishing SUTAB. Husband called about 2000. I offered an antiemetic now and before the 2nd round of SUTAB and they declined. She was having multiple bowel movements following the first round of SUTAB. Offered 2 bottles of MagCitrate for 2nd round of prep which they agreed to do. Patient call this morning reporting she vomited after taking the Mag Citrate. Fecal effluent is brownish tinged fluid. Advised to report for colonoscopy as scheduled today to assess in person.

## 2019-12-09 ENCOUNTER — Ambulatory Visit (INDEPENDENT_AMBULATORY_CARE_PROVIDER_SITE_OTHER): Payer: Managed Care, Other (non HMO) | Admitting: Emergency Medicine

## 2019-12-09 ENCOUNTER — Encounter: Payer: Self-pay | Admitting: Emergency Medicine

## 2019-12-09 ENCOUNTER — Other Ambulatory Visit: Payer: Self-pay

## 2019-12-09 VITALS — BP 154/87 | HR 85 | Temp 98.2°F | Resp 16 | Ht 58.75 in | Wt 140.0 lb

## 2019-12-09 DIAGNOSIS — F419 Anxiety disorder, unspecified: Secondary | ICD-10-CM

## 2019-12-09 DIAGNOSIS — I10 Essential (primary) hypertension: Secondary | ICD-10-CM | POA: Diagnosis not present

## 2019-12-09 DIAGNOSIS — F411 Generalized anxiety disorder: Secondary | ICD-10-CM

## 2019-12-09 DIAGNOSIS — F32A Depression, unspecified: Secondary | ICD-10-CM

## 2019-12-09 MED ORDER — BUPROPION HCL ER (XL) 150 MG PO TB24: 150.0000 mg | ORAL_TABLET | Freq: Every day | ORAL | 1 refills | Status: DC

## 2019-12-09 MED ORDER — PROPRANOLOL HCL ER 60 MG PO CP24: 60.0000 mg | ORAL_CAPSULE | Freq: Every day | ORAL | 5 refills | Status: DC

## 2019-12-09 NOTE — Progress Notes (Signed)
Tara Alvarado 49 y.o.   Chief Complaint  Patient presents with  . Anxiety    and depression per patient has increased and gotten worse in the past month    HISTORY OF PRESENT ILLNESS: This is a 49 y.o. female with a history of chronic depression and anxiety since her teens with chronic recurrent episodes worse the past 1 to 2 months.  Gets occasional panic attacks with palpitations, sweating, and feeling of impending doom. Presently not on medication but years ago was treated with Wellbutrin and Klonopin.  Requesting same medications. Does not have a local psychiatrist.  Needs referral. Depression screen [facility] 2/9 12/09/2019 09/03/2019 05/12/2018 01/08/2017  Decreased Interest 3 0 0 0  Down, Depressed, Hopeless 3 3 0 0  PHQ - 2 Score 6 3 0 0  Altered sleeping 3 3 - -  Tired, decreased energy 3 2 - -  Change in appetite 2 0 - -  Feeling bad or failure about yourself  1 0 - -  Trouble concentrating 3 0 - -  Moving slowly or fidgety/restless 3 0 - -  Suicidal thoughts 1 0 - -  PHQ-9 Score 22 8 - -  Difficult doing work/chores Somewhat difficult Somewhat difficult - -   GAD 7 : Generalized Anxiety Score 12/09/2019  Nervous, Anxious, on Edge 3  Control/stop worrying 3  Worry too much - different things 3  Trouble relaxing 3  Restless 3  Easily annoyed or irritable 2  Afraid - awful might happen 3  Total GAD 7 Score 20  Anxiety Difficulty Somewhat difficult      HPI   Prior to Admission medications   Medication Sig Start Date End Date Taking? Authorizing Provider  Cholecalciferol 25 MCG (1000 UT) capsule Vitamin D3 25 mcg (1,000 unit) capsule  Take 1 capsule every day by oral route.   Yes [provider]  Ibuprofen (MOTRIN PO) Take by mouth.   Yes [provider]  Sodium Sulfate-Mag Sulfate-KCl (SUTAB) [phone] MG TABS Take 12 tablets by mouth as directed. Patient not taking: Reported on 12/09/2019 10/27/19   Napoleon Form, MD    No Known  Allergies  There are no problems to display for this patient.   Past Medical History:  Diagnosis Date  . Allergy    seasonal  . Anxiety   . Hyperlipidemia    Not currently being tx.     Past Surgical History:  Procedure Laterality Date  . CESAREAN SECTION    . OVARIAN CYST SURGERY      Social History   Socioeconomic History  . Marital status: Married    Spouse name: Not on file  . Number of children: 3  . Years of education: Not on file  . Highest education level: Not on file  Occupational History  . Not on file  Tobacco Use  . Smoking status: Former Games developer  . Smokeless tobacco: Never Used  Vaping Use  . Vaping Use: Never used  Substance and Sexual Activity  . Alcohol use: No  . Drug use: No  . Sexual activity: Not Currently  Other Topics Concern  . Not on file  Social History Narrative  . Not on file   Social Determinants of Health   Financial Resource Strain:   . Difficulty of Paying Living Expenses: Not on file  Food Insecurity:   . Worried About Programme researcher, broadcasting/film/video in the Last Year: Not on file  . Ran Out of Food in the Last Year: Not  on file  Transportation Needs:   . Lack of Transportation (Medical): Not on file  . Lack of Transportation (Non-Medical): Not on file  Physical Activity:   . Days of Exercise per Week: Not on file  . Minutes of Exercise per Session: Not on file  Stress:   . Feeling of Stress : Not on file  Social Connections:   . Frequency of Communication with Friends and Family: Not on file  . Frequency of Social Gatherings with Friends and Family: Not on file  . Attends Religious Services: Not on file  . Active Member of Clubs or Organizations: Not on file  . Attends BankerClub or Organization Meetings: Not on file  . Marital Status: Not on file  Intimate Partner Violence:   . Fear of Current or Ex-Partner: Not on file  . Emotionally Abused: Not on file  . Physically Abused: Not on file  . Sexually Abused: Not on file    Family  History  Problem Relation Age of Onset  . Heart disease Mother 7155       AMI/CAD  . Cancer Mother        uterus  . Colon polyps Mother 7366  . Heart disease Father 260       AMI x 2  . Hyperlipidemia Father   . Cancer Father        kidney  . CAD Other   . Cancer Other        breast  . Heart disease Paternal Grandmother   . Cancer Paternal Grandmother        stomach  . Stomach cancer Paternal Grandmother [id number]  . Colon cancer Neg Hx   . Esophageal cancer Neg Hx   . Rectal cancer Neg Hx      Review of Systems  Constitutional: Negative.  Negative for chills and fever.  HENT: Negative.  Negative for congestion and sore throat.   Respiratory: Negative.  Negative for cough and shortness of breath.   Cardiovascular: Negative.  Negative for chest pain and palpitations.  Gastrointestinal: Negative.  Negative for abdominal pain, diarrhea, nausea and vomiting.  Genitourinary: Negative.  Negative for dysuria and hematuria.  Skin: Negative.  Negative for rash.  Neurological: Negative.  Negative for dizziness and headaches.  All other systems reviewed and are negative.   Today's Vitals   12/09/19 0958  BP: (!) 154/87  Pulse: 85  Resp: 16  Temp: 98.2 F (36.8 C)  TempSrc: Temporal  SpO2: 99%  Weight: 140 lb (63.5 kg)  Height: 4' 10.75" (1.492 m)   Body mass index is 28.52 kg/m. BP Readings from Last 3 Encounters:  12/09/19 (!) 154/87  11/10/19 (!) 144/89  09/03/19 140/84    Physical Exam Vitals reviewed.  Constitutional:      Appearance: Normal appearance.  HENT:     Head: Normocephalic.  Eyes:     Extraocular Movements: Extraocular movements intact.     Pupils: Pupils are equal, round, and reactive to light.  Cardiovascular:     Rate and Rhythm: Normal rate and regular rhythm.     Pulses: Normal pulses.     Heart sounds: Normal heart sounds.  Pulmonary:     Effort: Pulmonary effort is normal.     Breath sounds: Normal breath sounds.  Musculoskeletal:     Cervical  back: Normal range of motion and neck supple.  Skin:    General: Skin is warm.  Neurological:     General: No focal deficit present.  Mental Status: She is alert and oriented to person, place, and time.  Psychiatric:        Mood and Affect: Mood normal.        Behavior: Behavior normal.    A total of 30 minutes was spent with the patient, greater than 50% of which was in counseling/coordination of care regarding chronic depression and anxiety, treatment, need for psychiatric evaluation, need to start new medication Wellbutrin, hypertension and need to start medication which will also help with chronic panic attacks, Inderal LA, review of most recent office visit notes, review of most recent blood work results, prognosis, documentation, and need for follow-up.   ASSESSMENT & PLAN: Karman was seen today for anxiety.  Diagnoses and all orders for this visit:  Chronic depression -     Ambulatory referral to Psychiatry -     buPROPion (WELLBUTRIN XL) 150 MG 24 hr tablet; Take 1 tablet (150 mg total) by mouth daily.  Essential hypertension -     propranolol ER (INDERAL LA) 60 MG 24 hr capsule; Take 1 capsule (60 mg total) by mouth daily.  Chronic anxiety  Generalized anxiety disorder    Patient Instructions       If you have lab work done today you will be contacted with your lab results within the next 2 weeks.  If you have not heard from [place] then please contact us. The fastest way to get your results is to register for My Chart.   IF you received an x-ray today, you will receive an invoice from [facility]. Please contact [facility] at [phone] with questions or concerns regarding your invoice.   IF you received labwork today, you will receive an invoice from [facility]. Please contact [facility] at [phone] with questions or concerns regarding your invoice.   Our billing staff will not be able to assist you with questions regarding bills from these  companies.  You will be contacted with the lab results as soon as they are available. The fastest way to get your results is to activate your My Chart account. Instructions are located on the last page of this paperwork. If you have not heard from [place] regarding the results in 2 weeks, please contact this office.     Persistent Depressive Disorder  Persistent depressive disorder (PDD) is a mental health condition. PDD causes symptoms of low-level depression for 2 years or longer. It may also be called long-term (chronic) depression or dysthymia. PDD may include episodes of more severe depression that last for about 2 weeks (major depressive disorder or MDD). PDD can affect the way you think, feel, and sleep. This condition may also affect your relationships. You may be more likely to get sick if you have PDD. Symptoms of PDD occur for most of the day and may include:  Feeling tired (fatigue).  Low energy.  Eating too much or too little.  Sleeping too much or too little.  Feeling restless or agitated.  Feeling hopeless.  Feeling worthless or guilty.  Feeling worried or nervous (anxiety).  Trouble concentrating or making decisions.  Low self-esteem.  A negative way of looking at things (outlook).  Not being able to have fun or feel pleasure.  Avoiding interacting with people.  Getting angry or annoyed easily (irritability).  Acting aggressive or angry. Follow these instructions at home: Activity  Go back to your normal activities as told by your doctor.  Exercise regularly as told by your doctor. General instructions  Take over-the-counter and prescription  medicines only as told by your doctor.  Do not drink alcohol. Or, limit how much alcohol you drink to no more than 1 drink a day for nonpregnant women and 2 drinks a day for men. One drink equals 12 oz of beer, 5 oz of wine, or 1 oz of hard liquor. Alcohol can affect any antidepressant medicines you are taking. Talk  with your doctor about your alcohol use.  Eat a healthy diet and get plenty of sleep.  Find activities that you enjoy each day.  Consider joining a support group. Your doctor may be able to suggest a support group.  Keep all follow-up visits as told by your doctor. This is important. Where to find more information [employer] on Mental Illness  www.nami.org [place] of Mental Health  http://www.maynard.net/ National Suicide Prevention Lifeline  (414)842-6267).  This is free, 24-hour help. Contact a doctor if:  Your symptoms get worse.  You have new symptoms.  You have trouble sleeping or doing your daily activities. Get help right away if:  You self-harm.  You have serious thoughts about hurting yourself or others.  You see, hear, taste, smell, or feel things that are not there (hallucinate). This information is not intended to replace advice given to you by your health care provider. Make sure you discuss any questions you have with your health care provider. Document Revised: 11/30/2016 Document Reviewed: 08/12/2015 Elsevier Patient Education  2020 Elsevier Inc.      Edwina Barth, MD [facility]

## 2019-12-09 NOTE — Patient Instructions (Addendum)
   If you have lab work done today you will be contacted with your lab results within the next 2 weeks.  If you have not heard from us then please contact us. The fastest way to get your results is to register for My Chart.   IF you received an x-ray today, you will receive an invoice from [facility]. Please contact [facility] at [phone] with questions or concerns regarding your invoice.   IF you received labwork today, you will receive an invoice from [facility]. Please contact [facility] at [phone] with questions or concerns regarding your invoice.   Our billing staff will not be able to assist you with questions regarding bills from these companies.  You will be contacted with the lab results as soon as they are available. The fastest way to get your results is to activate your My Chart account. Instructions are located on the last page of this paperwork. If you have not heard from us regarding the results in 2 weeks, please contact this office.     Persistent Depressive Disorder  Persistent depressive disorder (PDD) is a mental health condition. PDD causes symptoms of low-level depression for 2 years or longer. It may also be called long-term (chronic) depression or dysthymia. PDD may include episodes of more severe depression that last for about 2 weeks (major depressive disorder or MDD). PDD can affect the way you think, feel, and sleep. This condition may also affect your relationships. You may be more likely to get sick if you have PDD. Symptoms of PDD occur for most of the day and may include:  Feeling tired (fatigue).  Low energy.  Eating too much or too little.  Sleeping too much or too little.  Feeling restless or agitated.  Feeling hopeless.  Feeling worthless or guilty.  Feeling worried or nervous (anxiety).  Trouble concentrating or making decisions.  Low self-esteem.  A negative way of looking at things (outlook).  Not being able  to have fun or feel pleasure.  Avoiding interacting with people.  Getting angry or annoyed easily (irritability).  Acting aggressive or angry. Follow these instructions at home: Activity  Go back to your normal activities as told by your doctor.  Exercise regularly as told by your doctor. General instructions  Take over-the-counter and prescription medicines only as told by your doctor.  Do not drink alcohol. Or, limit how much alcohol you drink to no more than 1 drink a day for nonpregnant women and 2 drinks a day for men. One drink equals 12 oz of beer, 5 oz of wine, or 1 oz of hard liquor. Alcohol can affect any antidepressant medicines you are taking. Talk with your doctor about your alcohol use.  Eat a healthy diet and get plenty of sleep.  Find activities that you enjoy each day.  Consider joining a support group. Your doctor may be able to suggest a support group.  Keep all follow-up visits as told by your doctor. This is important. Where to find more information National Alliance on Mental Illness  www.nami.org [place] National Institute of Mental Health  www.nimh.nih.gov National Suicide Prevention Lifeline  (1-800-273-8255).  This is free, 24-hour help. Contact a doctor if:  Your symptoms get worse.  You have new symptoms.  You have trouble sleeping or doing your daily activities. Get help right away if:  You self-harm.  You have serious thoughts about hurting yourself or others.  You see, hear, taste, smell, or feel things that are not there (  hallucinate). This information is not intended to replace advice given to you by your health care provider. Make sure you discuss any questions you have with your health care provider. Document Revised: 11/30/2016 Document Reviewed: 08/12/2015 Elsevier Patient Education  2020 [employer].

## 2019-12-15 ENCOUNTER — Telehealth: Payer: Self-pay | Admitting: *Deleted

## 2019-12-15 NOTE — Telephone Encounter (Signed)
Pre-visit 01/11/20 @ 10:00 am. 2 day Mirlax prep. Patient has copy of instructions.

## 2020-01-11 ENCOUNTER — Telehealth: Payer: Self-pay

## 2020-01-11 ENCOUNTER — Encounter: Payer: Self-pay | Admitting: Gastroenterology

## 2020-01-11 ENCOUNTER — Ambulatory Visit (AMBULATORY_SURGERY_CENTER): Payer: Managed Care, Other (non HMO)

## 2020-01-11 ENCOUNTER — Other Ambulatory Visit: Payer: Self-pay

## 2020-01-11 VITALS — Ht <= 58 in | Wt 140.0 lb

## 2020-01-11 DIAGNOSIS — Z1211 Encounter for screening for malignant neoplasm of colon: Secondary | ICD-10-CM

## 2020-01-11 NOTE — Telephone Encounter (Signed)
Call to pt for virtual pv, lm on vm.

## 2020-01-11 NOTE — Progress Notes (Signed)
No egg or soy allergy known to patient  No issues with past sedation with any surgeries or procedures No intubation problems in the past  No FH of Malignant Hyperthermia No diet pills per patient No home 02 use per patient  No blood thinners per patient  Pt denies issues with constipation  No A fib or A flutter  EMMI video to pt or via MyChart  COVID 19 guidelines implemented in [facility] today with Pt and RN  Pt is fully vaccinated  for Covid   VIRTUAL PREVISIT  Pt speaks english fluently, has instructions already printed from when she was here in November.  Due to the COVID-19 pandemic we are asking patients to follow certain guidelines.  Pt aware of COVID protocols and [facility] guidelines

## 2020-01-14 ENCOUNTER — Encounter: Payer: Self-pay | Admitting: Gastroenterology

## 2020-01-14 ENCOUNTER — Other Ambulatory Visit: Payer: Self-pay

## 2020-01-14 ENCOUNTER — Ambulatory Visit (AMBULATORY_SURGERY_CENTER): Payer: Managed Care, Other (non HMO) | Admitting: Gastroenterology

## 2020-01-14 VITALS — BP 121/69 | HR 64 | Temp 97.5°F | Resp 13 | Ht <= 58 in | Wt 140.0 lb

## 2020-01-14 DIAGNOSIS — Z1211 Encounter for screening for malignant neoplasm of colon: Secondary | ICD-10-CM | POA: Diagnosis not present

## 2020-01-14 MED ORDER — SODIUM CHLORIDE 0.9 % IV SOLN: 500.0000 mL | Freq: Once | INTRAVENOUS | Status: DC

## 2020-01-14 NOTE — Op Note (Signed)
[facility] Patient Name: Tara Alvarado Procedure Date: 01/14/2020 9:24 AM MRN: [id number] Endoscopist: Napoleon Form , MD Age: 50 Referring MD:  Date of Birth: [birth year] Gender: Female Account #: [id number] Procedure:                Colonoscopy Indications:              Screening for colorectal malignant neoplasm Medicines:                Monitored Anesthesia Care Procedure:                Pre-Anesthesia Assessment:                           - Prior to the procedure, a History and Physical                            was performed, and patient medications and                            allergies were reviewed. The patient's tolerance of                            previous anesthesia was also reviewed. The risks                            and benefits of the procedure and the sedation                            options and risks were discussed with the patient.                            All questions were answered, and informed consent                            was obtained. Prior Anticoagulants: The patient has                            taken no previous anticoagulant or antiplatelet                            agents. ASA Grade Assessment: II - A patient with                            mild systemic disease. After reviewing the risks                            and benefits, the patient was deemed in                            satisfactory condition to undergo the procedure.                           After obtaining informed consent, the colonoscope  was passed under direct vision. Throughout the                            procedure, the patient's blood pressure, pulse, and                            oxygen saturations were monitored continuously. The                            Olympus PFC-H190DL [id number]) Colonoscope was                            introduced through the anus and advanced to the the                            cecum,  identified by appendiceal orifice and                            ileocecal valve. The colonoscopy was performed                            without difficulty. The patient tolerated the                            procedure well. The quality of the bowel                            preparation was excellent. The ileocecal valve,                            appendiceal orifice, and rectum were photographed. Scope In: 9:41:07 AM Scope Out: 9:52:27 AM Scope Withdrawal Time: 0 hours 7 minutes 2 seconds  Total Procedure Duration: 0 hours 11 minutes 20 seconds  Findings:                 The perianal and digital rectal examinations were                            normal.                           Scattered small and large-mouthed diverticula were                            found in the sigmoid colon, descending colon,                            transverse colon and ascending colon.                           Non-bleeding external and internal hemorrhoids were                            found during retroflexion. The hemorrhoids were  small.                           The exam was otherwise without abnormality. Complications:            No immediate complications. Estimated Blood Loss:     Estimated blood loss was minimal. Impression:               - Diverticulosis in the sigmoid colon, in the                            descending colon, in the transverse colon and in                            the ascending colon.                           - Non-bleeding external and internal hemorrhoids.                           - The examination was otherwise normal.                           - No specimens collected. Recommendation:           - Patient has a contact number available for                            emergencies. The signs and symptoms of potential                            delayed complications were discussed with the                            patient. Return to normal  activities tomorrow.                            Written discharge instructions were provided to the                            patient.                           - Resume previous diet.                           - Continue present medications.                           - Repeat colonoscopy in 10 years for screening                            purposes. Napoleon Form, MD 01/14/2020 9:57:54 AM This report has been signed electronically.

## 2020-01-14 NOTE — Patient Instructions (Addendum)
HANDOUTS PROVIDED ON: DIVERTICULOSIS & HEMORRHOIDS  Your next colonoscopy should occur in 10 years.    You may resume your previous diet and medication schedule.  Thank you for allowing [place] to care for you today!!!   USTED TUVO UN PROCEDIMIENTO ENDOSCPICO HOY EN EL [facility]:   Lea el informe del procedimiento que se le entreg para cualquier pregunta especfica sobre lo que se Dentist.  Si el informe del examen no responde a sus preguntas, por favor llame a su gastroenterlogo para aclararlo.  Si usted solicit que no se le den [employer] de lo que se Clinical cytogeneticist en su procedimiento al [employer] va a cuidar, entonces el informe del procedimiento se ha incluido en un sobre sellado para que usted lo revise despus cuando le sea ms conveniente.   LO QUE PUEDE ESPERAR: Algunas sensaciones de hinchazn en el abdomen.  Puede tener ms gases de lo normal.  El caminar puede ayudarle a eliminar el aire que se le puso en el tracto gastrointestinal durante el procedimiento y reducir la hinchazn.  Si le hicieron una endoscopia inferior (como una colonoscopia o una sigmoidoscopia flexible), podra notar manchas de sangre en las heces fecales o en el papel higinico.  Si se someti a una preparacin intestinal para su procedimiento, es posible que no tenga una evacuacin intestinal normal durante [employer].   Tenga en cuenta:  Es posible que note un poco de irritacin y congestin en la nariz o algn drenaje.  Esto es debido al oxgeno [employer] durante su procedimiento.  No hay que preocuparse y esto debe desaparecer ms o Regulatory affairs officer.   SNTOMAS PARA REPORTAR INMEDIATAMENTE:  Despus de una endoscopia inferior (colonoscopia o sigmoidoscopia flexible):  Cantidades excesivas de sangre en las heces fecales  Sensibilidad significativa o empeoramiento de los dolores abdominales   Hinchazn aguda del abdomen que antes no tena   Fiebre de 100F o ms    Para  asuntos urgentes o de Associate Professor, puede comunicarse con un gastroenterlogo a cualquier hora llamando al [phone].  DIETA:  Recomendamos una comida pequea al principio, pero luego puede continuar con su dieta normal.  Tome muchos lquidos, Tax adviser las bebidas alcohlicas durante 24 horas.    ACTIVIDAD:  Debe planear tomarse las cosas con calma por el resto del da y no debe CONDUCIR ni usar maquinaria pesada Patent examiner (debido a los medicamentos de sedacin utilizados durante el examen).     SEGUIMIENTO: Nuestro personal llamar al nmero que aparece en su historial al siguiente da hbil de su procedimiento para ver cmo se siente y para responder cualquier pregunta o inquietud que pueda tener con respecto a la informacin que se le dio despus del procedimiento. Si no podemos contactarle, le dejaremos un mensaje.  Sin embargo, si se siente bien y no tiene English as a second language teacher, no es necesario que nos devuelva la llamada.  Asumiremos que ha regresado a sus actividades diarias normales sin incidentes. Si se le tomaron algunas biopsias, le contactaremos por telfono o por carta en las prximas 3 semanas.  Si no ha sabido [employer] biopsias en el transcurso de 3 semanas, por favor llmenos al [phone].   FIRMAS/CONFIDENCIALIDAD: Usted y/o el acompaante que le cuide han firmado documentos que se ingresarn en su historial mdico electrnico.  Estas firmas atestiguan el hecho de que la informacin anterior

## 2020-01-14 NOTE — Progress Notes (Signed)
pt tolerated well. VSS. awake and to recovery. Report given to RN.  

## 2020-01-14 NOTE — Progress Notes (Signed)
Medical history reviewed with no changes noted. VS assessed by C.W 

## 2020-01-19 ENCOUNTER — Telehealth: Payer: Self-pay

## 2020-01-19 NOTE — Telephone Encounter (Signed)
  Follow up Call-  Call back number 01/14/2020 11/10/2019  Post procedure Call Back phone  # [phone] #[phone] cell  Permission to leave phone message Yes Yes  Some recent data might be hidden     Patient questions:  Do you have a fever, pain , or abdominal swelling? No. Pain Score  0 *  Have you tolerated food without any problems? No. pt states she is only drinking liquids due to nausea and "has a cold"  Have you been able to return to your normal activities? No.  Do you have any questions about your discharge instructions: Diet   No. Medications  No. Follow up visit  No.  Do you have questions or concerns about your Care? No.   Let pt know to please test for covid and let us know if she is positive, verb understanding.  Actions: * If pain score is 4 or above: No action needed, pain <4.  1. Have you developed a fever since your procedure? Pt states she doesn't know bc she does not have a thermometer.  2.   Have you had an respiratory symptoms (SOB or cough) since your procedure? yes  3.   Have you tested positive for COVID 19 since your procedure no  4.   Have you had any family members/close contacts diagnosed with the COVID 19 since your procedure?  no   If yes to any of these questions please route to Laverna Peace, RN and Karlton Lemon, RN

## 2020-01-19 NOTE — Telephone Encounter (Signed)
Patient is returning a call also stated she was tested for Covid and the results are positive

## 2020-01-19 NOTE — Telephone Encounter (Signed)
Thanks for letting us know. 

## 2020-01-19 NOTE — Telephone Encounter (Signed)
Pt from 01-14-20 has had cold sx since Saturday and tested positive today for covid.

## 2020-01-21 NOTE — Telephone Encounter (Signed)
Pt tested positive for covid

## 2020-04-24 LAB — COLOGUARD: Cologuard: NEGATIVE

## 2020-05-13 LAB — HM MAMMOGRAPHY

## 2020-05-18 ENCOUNTER — Encounter: Payer: Self-pay | Admitting: Emergency Medicine

## 2020-05-25 ENCOUNTER — Other Ambulatory Visit: Payer: Self-pay | Admitting: Emergency Medicine

## 2020-05-25 DIAGNOSIS — F32A Depression, unspecified: Secondary | ICD-10-CM

## 2020-05-26 LAB — HM MAMMOGRAPHY

## 2020-06-03 ENCOUNTER — Encounter: Payer: Self-pay | Admitting: *Deleted

## 2020-06-07 ENCOUNTER — Encounter: Payer: Self-pay | Admitting: *Deleted

## 2020-06-13 ENCOUNTER — Other Ambulatory Visit: Payer: Self-pay | Admitting: Emergency Medicine

## 2020-06-13 DIAGNOSIS — I10 Essential (primary) hypertension: Secondary | ICD-10-CM

## 2020-06-21 ENCOUNTER — Encounter: Payer: Self-pay | Admitting: Emergency Medicine

## 2020-06-21 ENCOUNTER — Other Ambulatory Visit: Payer: Self-pay

## 2020-06-21 ENCOUNTER — Ambulatory Visit (INDEPENDENT_AMBULATORY_CARE_PROVIDER_SITE_OTHER): Payer: Managed Care, Other (non HMO) | Admitting: Emergency Medicine

## 2020-06-21 VITALS — BP 130/80 | HR 84 | Temp 98.7°F | Ht <= 58 in | Wt 144.8 lb

## 2020-06-21 DIAGNOSIS — M790 Rheumatism, unspecified: Secondary | ICD-10-CM

## 2020-06-21 DIAGNOSIS — M791 Myalgia, unspecified site: Secondary | ICD-10-CM

## 2020-06-21 DIAGNOSIS — R5381 Other malaise: Secondary | ICD-10-CM | POA: Diagnosis not present

## 2020-06-21 DIAGNOSIS — R5383 Other fatigue: Secondary | ICD-10-CM | POA: Diagnosis not present

## 2020-06-21 LAB — CBC WITH DIFFERENTIAL/PLATELET
Basophils Absolute: 0 10*3/uL (ref 0.0–0.1)
Basophils Relative: 0.9 % (ref 0.0–3.0)
Eosinophils Absolute: 0.3 10*3/uL (ref 0.0–0.7)
Eosinophils Relative: 5.2 % — ABNORMAL HIGH (ref 0.0–5.0)
HCT: 41.2 % (ref 36.0–46.0)
Hemoglobin: 13.9 g/dL (ref 12.0–15.0)
Lymphocytes Relative: 16.7 % (ref 12.0–46.0)
Lymphs Abs: 0.9 10*3/uL (ref 0.7–4.0)
MCHC: 33.8 g/dL (ref 30.0–36.0)
MCV: 86.9 fl (ref 78.0–100.0)
Monocytes Absolute: 0.4 10*3/uL (ref 0.1–1.0)
Monocytes Relative: 8.1 % (ref 3.0–12.0)
Neutro Abs: 3.6 10*3/uL (ref 1.4–7.7)
Neutrophils Relative %: 69.1 % (ref 43.0–77.0)
Platelets: 256 10*3/uL (ref 150.0–400.0)
RBC: 4.74 Mil/uL (ref 3.87–5.11)
RDW: 12.8 % (ref 11.5–15.5)
WBC: 5.2 10*3/uL (ref 4.0–10.5)

## 2020-06-21 LAB — COMPREHENSIVE METABOLIC PANEL
ALT: 26 U/L (ref 0–35)
AST: 22 U/L (ref 0–37)
Albumin: 4.7 g/dL (ref 3.5–5.2)
Alkaline Phosphatase: 112 U/L (ref 39–117)
BUN: 12 mg/dL (ref 6–23)
CO2: 30 mEq/L (ref 19–32)
Calcium: 10.1 mg/dL (ref 8.4–10.5)
Chloride: 100 mEq/L (ref 96–112)
Creatinine, Ser: 0.71 mg/dL (ref 0.40–1.20)
GFR: 97.88 mL/min (ref >60.00)
Glucose, Bld: 83 mg/dL (ref 70–99)
Potassium: 4 mEq/L (ref 3.5–5.1)
Sodium: 137 mEq/L (ref 135–145)
Total Bilirubin: 0.6 mg/dL (ref 0.2–1.2)
Total Protein: 7.8 g/dL (ref 6.0–8.3)

## 2020-06-21 LAB — TSH: TSH: 0.87 u[IU]/mL (ref 0.35–4.50)

## 2020-06-21 LAB — LIPID PANEL
Cholesterol: 251 mg/dL — ABNORMAL HIGH (ref 0–200)
HDL: 82.4 mg/dL (ref >39.00)
LDL Cholesterol: 154 mg/dL — ABNORMAL HIGH (ref 0–99)
NonHDL: 168.88
Total CHOL/HDL Ratio: 3
Triglycerides: 76 mg/dL (ref 0.0–149.0)
VLDL: 15.2 mg/dL (ref 0.0–40.0)

## 2020-06-21 LAB — SEDIMENTATION RATE: Sed Rate: 12 mm/hr (ref 0–30)

## 2020-06-21 LAB — CK: Total CK: 96 U/L (ref 7–177)

## 2020-06-21 LAB — HEMOGLOBIN A1C: Hgb A1c MFr Bld: 5.9 % (ref 4.6–6.5)

## 2020-06-21 NOTE — Progress Notes (Signed)
Tara Alvarado 50 y.o.   Chief Complaint  Patient presents with   Leg Pain    Left since 09/03/2019, per patient it has gotten worse    HISTORY OF PRESENT ILLNESS: This is a 50 y.o. female complaining of lack of energy, diffuse myalgias, body hurting and hands hurting for the past several months.  Denies injuries.  Denies any other associated symptoms.  No history of rheumatologic condition.  HPI   Prior to Admission medications   Medication Sig Start Date End Date Taking? Authorizing Provider  buPROPion (WELLBUTRIN XL) 150 MG 24 hr tablet TAKE 1 TABLET BY MOUTH EVERY DAY 05/25/20  Yes Tara Alvarado, Tara Kempf, MD  Cholecalciferol 25 MCG (1000 UT) capsule Vitamin D3 25 mcg (1,000 unit) capsule  Take 1 capsule every day by oral route.   Yes [provider]  Ibuprofen (MOTRIN PO) Take by mouth.   Yes [provider]  propranolol ER (INDERAL LA) 60 MG 24 hr capsule TAKE 1 CAPSULE BY MOUTH EVERY DAY 06/14/20  Yes Tara Quint, MD    No Known Allergies  Patient Active Problem List   Diagnosis Date Noted   Generalized anxiety disorder 12/09/2019   Chronic depression 12/09/2019   Essential hypertension 12/09/2019    Past Medical History:  Diagnosis Date   Allergy    seasonal   Anxiety    Hyperlipidemia    Not currently being tx.     Past Surgical History:  Procedure Laterality Date   CESAREAN SECTION     OVARIAN CYST SURGERY      Social History   Socioeconomic History   Marital status: Married    Spouse name: Not on file   Number of children: 3   Years of education: Not on file   Highest education level: Not on file  Occupational History   Not on file  Tobacco Use   Smoking status: Former    Pack years: 0.00   Smokeless tobacco: Never  Vaping Use   Vaping Use: Never used  Substance and Sexual Activity   Alcohol use: No   Drug use: No   Sexual activity: Not Currently  Other Topics Concern   Not on file  Social History Narrative   Not  on file   Social Determinants of Health   Financial Resource Strain: Not on file  Food Insecurity: Not on file  Transportation Needs: Not on file  Physical Activity: Not on file  Stress: Not on file  Social Connections: Not on file  Intimate Partner Violence: Not on file    Family History  Problem Relation Age of Onset   Heart disease Mother 27       AMI/CAD   Cancer Mother        uterus   Colon polyps Mother 74   Heart disease Father 69       AMI x 2   Hyperlipidemia Father    Cancer Father        kidney   CAD Other    Cancer Other        breast   Heart disease Paternal Grandmother    Cancer Paternal Grandmother        stomach   Stomach cancer Paternal Grandmother 17   Colon cancer Neg Hx    Esophageal cancer Neg Hx    Rectal cancer Neg Hx      Review of Systems  Constitutional:  Positive for malaise/fatigue. Negative for chills and fever.  HENT: Negative.  Negative for  congestion and sore throat.   Eyes:  Negative for blurred vision and double vision.  Respiratory: Negative.  Negative for cough and shortness of breath.   Cardiovascular: Negative.  Negative for chest pain and palpitations.  Gastrointestinal: Negative.  Negative for abdominal pain, blood in stool, diarrhea, melena, nausea and vomiting.  Genitourinary: Negative.  Negative for dysuria and hematuria.  Musculoskeletal:  Positive for back pain, joint pain and myalgias.  Skin: Negative.  Negative for rash.  Neurological:  Negative for dizziness and headaches.  Endo/Heme/Allergies:  Does not bruise/bleed easily.  Psychiatric/Behavioral:  Positive for depression. The patient is nervous/anxious.   All other systems reviewed and are negative.  Today's Vitals   06/21/20 1344  BP: 130/80  Pulse: 84  Temp: 98.7 F (37.1 C)  TempSrc: Oral  SpO2: 97%  Weight: 144 lb 12.8 oz (65.7 kg)  Height: 4\' 9"  (1.448 m)   Body mass index is 31.33 kg/m.  Physical Exam Vitals reviewed.  Constitutional:       Appearance: Normal appearance.  HENT:     Head: Normocephalic.     Mouth/Throat:     Mouth: Mucous membranes are moist.     Pharynx: Oropharynx is clear.  Eyes:     Extraocular Movements: Extraocular movements intact.     Conjunctiva/sclera: Conjunctivae normal.     Pupils: Pupils are equal, round, and reactive to light.  Cardiovascular:     Rate and Rhythm: Normal rate and regular rhythm.     Pulses: Normal pulses.     Heart sounds: Normal heart sounds.  Pulmonary:     Effort: Pulmonary effort is normal.     Breath sounds: Normal breath sounds.  Musculoskeletal:        General: Normal range of motion.     Cervical back: Normal range of motion and neck supple.  Skin:    General: Skin is warm and dry.     Capillary Refill: Capillary refill takes less than 2 seconds.  Neurological:     General: No focal deficit present.     Mental Status: She is alert and oriented to person, place, and time.  Psychiatric:        Mood and Affect: Mood normal.        Behavior: Behavior normal.     ASSESSMENT & PLAN: A total of 30 minutes was spent with the patient and counseling/coordination of care regarding differential diagnosis of rheumatism including possible fibromyalgia and lupus, need for rheumatology evaluation, need for diagnostic work-up including blood work, review of most recent office visit notes, review of most recent blood work results, education on nutrition, generalized anxiety and depression and mind/body connection, stress management, prognosis, documentation and need for follow-up.  Rheumatism Differential diagnosis discussed with patient.  Needs diagnostic work-up and rheumatology evaluation.  Fibromyalgia a likely diagnosis. Stress, anxiety and depression discussed as contributing factors. Follow-up after rheumatology evaluation. Tara Alvarado was seen today for leg pain.  Diagnoses and all orders for this visit:  Lack of energy -     Sedimentation rate -     CBC with  Differential/Platelet -     Comprehensive metabolic panel -     TSH -     Lipid panel -     Hemoglobin A1c -     ANA,IFA RA Diag Pnl w/rflx Tit/Patn  Myalgia -     CK  Malaise and fatigue  Rheumatism -     Ambulatory referral to Rheumatology  Patient Instructions  Health Maintenance, Female Adopting  a healthy lifestyle and getting preventive care are important in promoting health and wellness. Ask your health care provider about: The right schedule for you to have regular tests and exams. Things you can do on your own to prevent diseases and keep yourself healthy. What should I know about diet, weight, and exercise? Eat a healthy diet  Eat a diet that includes plenty of vegetables, fruits, low-fat dairy products, and lean protein. Do not eat a lot of foods that are high in solid fats, added sugars, or sodium.  Maintain a healthy weight Body mass index (BMI) is used to identify weight problems. It estimates body fat based on height and weight. Your health care provider can help determineyour BMI and help you achieve or maintain a healthy weight. Get regular exercise Get regular exercise. This is one of the most important things you can do for your health. Most adults should: Exercise for at least 150 minutes each week. The exercise should increase your heart rate and make you sweat (moderate-intensity exercise). Do strengthening exercises at least twice a week. This is in addition to the moderate-intensity exercise. Spend less time sitting. Even light physical activity can be beneficial. Watch cholesterol and blood lipids Have your blood tested for lipids and cholesterol at 52 years of age, then havethis test every 5 years. Have your cholesterol levels checked more often if: Your lipid or cholesterol levels are high. You are older than 52 years of age. You are at high risk for heart disease. What should I know about cancer screening? Depending on your health history and family  history, you may need to have cancer screening at various ages. This may include screening for: Breast cancer. Cervical cancer. Colorectal cancer. Skin cancer. Lung cancer. What should I know about heart disease, diabetes, and high blood pressure? Blood pressure and heart disease High blood pressure causes heart disease and increases the risk of stroke. This is more likely to develop in people who have high blood pressure readings, are of African descent, or are overweight. Have your blood pressure checked: Every 3-5 years if you are 8118-52 years of age. Every year if you are 52 years old or older. Diabetes Have regular diabetes screenings. This checks your fasting blood sugar level. Have the screening done: Once every three years after age 52 if you are at a normal weight and have a low risk for diabetes. More often and at a younger age if you are overweight or have a high risk for diabetes. What should I know about preventing infection? Hepatitis B If you have a higher risk for hepatitis B, you should be screened for this virus. Talk with your health care provider to find out if you are at risk forhepatitis B infection. Hepatitis C Testing is recommended for: Everyone born from [id number] through 1967. Anyone with known risk factors for hepatitis C. Sexually transmitted infections (STIs) Get screened for STIs, including gonorrhea and chlamydia, if: You are sexually active and are younger than 52 years of age. You are older than 52 years of age and your health care provider tells you that you are at risk for this type of infection. Your sexual activity has changed since you were last screened, and you are at increased risk for chlamydia or gonorrhea. Ask your health care provider if you are at risk. Ask your health care provider about whether you are at high risk for HIV. Your health care provider may recommend a prescription medicine to help prevent HIV infection. If  you choose to take  medicine to prevent HIV, you should first get tested for HIV. You should then be tested every 3 months for as long as you are taking the medicine. Pregnancy If you are about to stop having your period (premenopausal) and you may become pregnant, seek counseling before you get pregnant. Take 400 to 800 micrograms (mcg) of folic acid every day if you become pregnant. Ask for birth control (contraception) if you want to prevent pregnancy. Osteoporosis and menopause Osteoporosis is a disease in which the bones lose minerals and strength with aging. This can result in bone fractures. If you are 53 years old or older, or if you are at risk for osteoporosis and fractures, ask your health care provider if you should: Be screened for bone loss. Take a calcium or vitamin D supplement to lower your risk of fractures. Be given hormone replacement therapy (HRT) to treat symptoms of menopause. Follow these instructions at home: Lifestyle Do not use any products that contain nicotine or tobacco, such as cigarettes, e-cigarettes, and chewing tobacco. If you need help quitting, ask your health care provider. Do not use street drugs. Do not share needles. Ask your health care provider for help if you need support or information about quitting drugs. Alcohol use Do not drink alcohol if: Your health care provider tells you not to drink. You are pregnant, may be pregnant, or are planning to become pregnant. If you drink alcohol: Limit how much you use to 0-1 drink a day. Limit intake if you are breastfeeding. Be aware of how much alcohol is in your drink. In the [place] one drink equals one 12 oz bottle of beer (355 mL), one 5 oz glass of wine (148 mL), or one 1 oz glass of hard liquor (44 mL). General instructions Schedule regular health, dental, and eye exams. Stay current with your vaccines. Tell your health care provider if: You often feel depressed. You have ever been abused or do not feel safe at  home. Summary Adopting a healthy lifestyle and getting preventive care are important in promoting health and wellness. Follow your health care provider's instructions about healthy diet, exercising, and getting tested or screened for diseases. Follow your health care provider's instructions on monitoring your cholesterol and blood pressure. This information is not intended to replace advice given to you by your health care provider. Make sure you discuss any questions you have with your healthcare provider. Document Revised: 12/11/2017 Document Reviewed: 12/11/2017 Elsevier Patient Education  2022 Else[employer] Inc.    Edwina Barth, MD  Primary [facility] at [facility]

## 2020-06-21 NOTE — Patient Instructions (Signed)
Health Maintenance, Female Adopting a healthy lifestyle and getting preventive care are important in promoting health and wellness. Ask your health care provider about: The right schedule for you to have regular tests and exams. Things you can do on your own to prevent diseases and keep yourself healthy. What should I know about diet, weight, and exercise? Eat a healthy diet  Eat a diet that includes plenty of vegetables, fruits, low-fat dairy products, and lean protein. Do not eat a lot of foods that are high in solid fats, added sugars, or sodium.  Maintain a healthy weight Body mass index (BMI) is used to identify weight problems. It estimates body fat based on height and weight. Your health care provider can help determineyour BMI and help you achieve or maintain a healthy weight. Get regular exercise Get regular exercise. This is one of the most important things you can do for your health. Most adults should: Exercise for at least 150 minutes each week. The exercise should increase your heart rate and make you sweat (moderate-intensity exercise). Do strengthening exercises at least twice a week. This is in addition to the moderate-intensity exercise. Spend less time sitting. Even light physical activity can be beneficial. Watch cholesterol and blood lipids Have your blood tested for lipids and cholesterol at 52 years of age, then havethis test every 5 years. Have your cholesterol levels checked more often if: Your lipid or cholesterol levels are high. You are older than 52 years of age. You are at high risk for heart disease. What should I know about cancer screening? Depending on your health history and family history, you may need to have cancer screening at various ages. This may include screening for: Breast cancer. Cervical cancer. Colorectal cancer. Skin cancer. Lung cancer. What should I know about heart disease, diabetes, and high blood pressure? Blood pressure and heart  disease High blood pressure causes heart disease and increases the risk of stroke. This is more likely to develop in people who have high blood pressure readings, are of African descent, or are overweight. Have your blood pressure checked: Every 3-5 years if you are 18-39 years of age. Every year if you are 40 years old or older. Diabetes Have regular diabetes screenings. This checks your fasting blood sugar level. Have the screening done: Once every three years after age 40 if you are at a normal weight and have a low risk for diabetes. More often and at a younger age if you are overweight or have a high risk for diabetes. What should I know about preventing infection? Hepatitis B If you have a higher risk for hepatitis B, you should be screened for this virus. Talk with your health care provider to find out if you are at risk forhepatitis B infection. Hepatitis C Testing is recommended for: Everyone born from 1945 through 1965. Anyone with known risk factors for hepatitis C. Sexually transmitted infections (STIs) Get screened for STIs, including gonorrhea and chlamydia, if: You are sexually active and are younger than 52 years of age. You are older than 52 years of age and your health care provider tells you that you are at risk for this type of infection. Your sexual activity has changed since you were last screened, and you are at increased risk for chlamydia or gonorrhea. Ask your health care provider if you are at risk. Ask your health care provider about whether you are at high risk for HIV. Your health care provider may recommend a prescription medicine to help   prevent HIV infection. If you choose to take medicine to prevent HIV, you should first get tested for HIV. You should then be tested every 3 months for as long as you are taking the medicine. Pregnancy If you are about to stop having your period (premenopausal) and you may become pregnant, seek counseling before you get  pregnant. Take 400 to 800 micrograms (mcg) of folic acid every day if you become pregnant. Ask for birth control (contraception) if you want to prevent pregnancy. Osteoporosis and menopause Osteoporosis is a disease in which the bones lose minerals and strength with aging. This can result in bone fractures. If you are 65 years old or older, or if you are at risk for osteoporosis and fractures, ask your health care provider if you should: Be screened for bone loss. Take a calcium or vitamin D supplement to lower your risk of fractures. Be given hormone replacement therapy (HRT) to treat symptoms of menopause. Follow these instructions at home: Lifestyle Do not use any products that contain nicotine or tobacco, such as cigarettes, e-cigarettes, and chewing tobacco. If you need help quitting, ask your health care provider. Do not use street drugs. Do not share needles. Ask your health care provider for help if you need support or information about quitting drugs. Alcohol use Do not drink alcohol if: Your health care provider tells you not to drink. You are pregnant, may be pregnant, or are planning to become pregnant. If you drink alcohol: Limit how much you use to 0-1 drink a day. Limit intake if you are breastfeeding. Be aware of how much alcohol is in your drink. In the [place] one drink equals one 12 oz bottle of beer (355 mL), one 5 oz glass of wine (148 mL), or one 1 oz glass of hard liquor (44 mL). General instructions Schedule regular health, dental, and eye exams. Stay current with your vaccines. Tell your health care provider if: You often feel depressed. You have ever been abused or do not feel safe at home. Summary Adopting a healthy lifestyle and getting preventive care are important in promoting health and wellness. Follow your health care provider's instructions about healthy diet, exercising, and getting tested or screened for diseases. Follow your health care provider's  instructions on monitoring your cholesterol and blood pressure. This information is not intended to replace advice given to you by your health care provider. Make sure you discuss any questions you have with your healthcare provider. Document Revised: 12/11/2017 Document Reviewed: 12/11/2017 Elsevier Patient Education  2022 [employer] Inc.  

## 2020-06-21 NOTE — Assessment & Plan Note (Signed)
Differential diagnosis discussed with patient.  Needs diagnostic work-up and rheumatology evaluation.  Fibromyalgia a likely diagnosis. Stress, anxiety and depression discussed as contributing factors. Follow-up after rheumatology evaluation.

## 2020-06-23 NOTE — Progress Notes (Signed)
Office Visit Note  Patient: Tara Alvarado             Date of Birth: April 05, 1970           MRN: [id number]             PCP: Horald Pollen, MD Referring: Horald Pollen, * Visit Date: 07/05/2020 Occupation: '@GUAROCC' @  Subjective:   pain in multiple joints and muscles    History of Present Illness: Tara Alvarado is a 50 y.o. female seen in consultation per request of her PCP for the evaluation of joint pain and muscle pain.  According the patient she has had history of muscle and joint pain for many years although in the last 2 to 3 years the symptoms have gotten worse.  She describes pain in her neck, shoulders, wrist, hands, hips, left knee and her feet.  She has noticed swelling in her hands and her feet.  She states all of her muscles in her body are painful.  She also suffers from insomnia for which she uses melatonin.  She experiences general fatigue.  She states she has been under a lot of stress and works long shifts.  She has noticed intermittent rash on her face.  She is gravida 3, para 3, miscarriages 0.  There is no family history of autoimmune disease.  Her mother has osteoarthritis.  Activities of Daily Living:  Patient reports morning stiffness for 2-3 hours.   Patient Reports nocturnal pain.  Difficulty dressing/grooming: Reports Difficulty climbing stairs: Reports Difficulty getting out of chair: Denies Difficulty using hands for taps, buttons, cutlery, and/or writing: Reports  Review of Systems  Constitutional:  Positive for fatigue.  HENT:  Negative for mouth sores, mouth dryness and nose dryness.   Eyes:  Positive for itching. Negative for pain and dryness.  Respiratory:  Positive for shortness of breath. Negative for difficulty breathing.   Cardiovascular:  Positive for palpitations. Negative for chest pain.  Gastrointestinal:  Positive for constipation and diarrhea. Negative for blood in stool.  Endocrine: Negative for increased urination.   Genitourinary:  Negative for difficulty urinating.  Musculoskeletal:  Positive for joint pain, joint pain, joint swelling, myalgias, morning stiffness, muscle tenderness and myalgias.  Skin:  Positive for rash. Negative for color change and sensitivity to sunlight.  Allergic/Immunologic: Negative for susceptible to infections.  Neurological:  Negative for dizziness, numbness, headaches and weakness.  Hematological:  Positive for bruising/bleeding tendency. Negative for swollen glands.  Psychiatric/Behavioral:  Positive for depressed mood and sleep disturbance. Negative for confusion. The patient is nervous/anxious.    PMFS History:  Patient Active Problem List   Diagnosis Date Noted   Rheumatism 06/21/2020   Generalized anxiety disorder 12/09/2019   Chronic depression 12/09/2019   Essential hypertension 12/09/2019    Past Medical History:  Diagnosis Date   Allergy    seasonal   Anxiety    Hyperlipidemia    Not currently being tx.     Family History  Problem Relation Age of Onset   Heart disease Mother 22       AMI/CAD   Cancer Mother        uterus   Colon polyps Mother 10   Heart disease Father 29       AMI x 2   Hyperlipidemia Father    Cancer Father        kidney   Hypertension Sister    Heart disease Paternal Grandmother    Cancer Paternal Grandmother  stomach   Stomach cancer Paternal Grandmother 54   CAD Other    Cancer Other        breast   Healthy Son    Colon polyps Daughter    Colon cancer Neg Hx    Esophageal cancer Neg Hx    Rectal cancer Neg Hx    Past Surgical History:  Procedure Laterality Date   CESAREAN SECTION     OVARIAN CYST SURGERY     Social History   Social History Narrative   Not on file   Immunization History  Administered Date(s) Administered   PFIZER(Purple Top)SARS-COV-2 Vaccination 09/09/2019, 10/04/2019     Objective: Vital Signs: BP (!) 156/90 (BP Location: Right Arm, Patient Position: Sitting, Cuff Size: Normal)    Pulse 69   Resp 14   Ht 4' 10.5" (1.486 m)   Wt 144 lb (65.3 kg)   BMI 29.58 kg/m    Physical Exam Vitals and nursing note reviewed.  Constitutional:      Appearance: She is well-developed.  HENT:     Head: Normocephalic and atraumatic.  Eyes:     Conjunctiva/sclera: Conjunctivae normal.  Cardiovascular:     Rate and Rhythm: Normal rate and regular rhythm.     Heart sounds: Normal heart sounds.  Pulmonary:     Effort: Pulmonary effort is normal.     Breath sounds: Normal breath sounds.  Abdominal:     General: Bowel sounds are normal.     Palpations: Abdomen is soft.  Musculoskeletal:     Cervical back: Normal range of motion.  Lymphadenopathy:     Cervical: No cervical adenopathy.  Skin:    General: Skin is warm and dry.     Capillary Refill: Capillary refill takes less than 2 seconds.     Comments: Mild facial erythema was noted.  Neurological:     Mental Status: She is alert and oriented to person, place, and time.  Psychiatric:        Behavior: Behavior normal.     Musculoskeletal Exam: C-spine, thoracic and lumbar spine were in good range of motion.  She had bilateral trapezius spasm, tenderness over bilateral trochanteric bursa and gluteal region.  Shoulder joints, elbow joints, wrist joints, MCPs PIPs and DIPs with good range of motion with no synovitis.  Hip joints, knee joints, ankles, MTPs and PIPs with good range of motion with no synovitis.  CDAI Exam: CDAI Score: -- Patient Global: --; Provider Global: -- Swollen: --; Tender: -- Joint Exam 07/05/2020   No joint exam has been documented for this visit   There is currently no information documented on the homunculus. Go to the Rheumatology activity and complete the homunculus joint exam.  Investigation: No additional findings.  Imaging: No results found.  Recent Labs: Lab Results  Component Value Date   WBC 5.2 06/21/2020   HGB 13.9 06/21/2020   PLT 256.0 06/21/2020   NA 137 06/21/2020   K  4.0 06/21/2020   CL 100 06/21/2020   CO2 30 06/21/2020   GLUCOSE 83 06/21/2020   BUN 12 06/21/2020   CREATININE 0.71 06/21/2020   BILITOT 0.6 06/21/2020   ALKPHOS 112 06/21/2020   AST 22 06/21/2020   ALT 26 06/21/2020   PROT 7.8 06/21/2020   ALBUMIN 4.7 06/21/2020   CALCIUM 10.1 06/21/2020   GFRAA 94 09/03/2019   June 21, 2020 ANA 1: 40NS, RF negative, anti-CCP negative, ESR 12, TSH normal, hemoglobin A1c 5.9, CK 96   Speciality Comments: No specialty  comments available.  Procedures:  No procedures performed Allergies: Patient has no known allergies.   Assessment / Plan:     Visit Diagnoses: Polyarthralgia-she complains of pain and discomfort in multiple joints for many years.  No synovitis was noted.  Pain, neck -she complains of neck pain and stiffness.  She had bilateral trapezius spasm.  Plan: XR Cervical Spine 2 or 3 views.  X-ray showed mild C5-C6 narrowing otherwise unremarkable.  Pain in both hands -she complains of pain and discomfort in her bilateral hands with intermittent swelling.  No synovitis was noted.  Plan: XR Hand 2 View Right, XR Hand 2 View Left.  X-ray of bilateral hands showed mild PIP narrowing.  We will schedule ultrasound of bilateral hands to look for synovitis.  All autoimmune work-up is negative.  ANA is low titer and not significant.  She has no clinical features of lupus.  Pain in both feet -she complains of discomfort in her bilateral feet.  No synovitis was noted.  Plan: XR Foot 2 Views Right, XR Foot 2 Views Left.  X-ray of bilateral feet showed mild degenerative changes.  Myalgia-she gives history of myalgias for many years.  She has generalized hyperalgesia and positive tender points.  Clinical findings are consistent with fibromyalgia syndrome.  She also gives history of fatigue and insomnia which may be contributed to fibromyalgia syndrome.  I will refer her to integrative therapies for physical therapy.  I have also placed a handout for neck and  hand exercises.  Other fatigue-she gets history of chronic fatigue due to insomnia and long hours at work.  Primary insomnia-she has been using melatonin for insomnia.  Essential hypertension-her blood pressure is elevated.  Have advised her to monitor blood pressure closely.  Generalized anxiety disorder-she stopped her antidepressant.  Patient states that she has tried Cymbalta in the past which she could not tolerate.  She tried Wellbutrin but she did not notice any benefit from both medications.  Chronic depression  Orders: Orders Placed This Encounter  Procedures   XR Cervical Spine 2 or 3 views   XR Hand 2 View Right   XR Hand 2 View Left   XR Foot 2 Views Right   XR Foot 2 Views Left    No orders of the defined types were placed in this encounter.    Follow-Up Instructions: Return for Pain in multiple joints and muscles .   Bo Merino, MD  Note - This record has been created using Editor, commissioning.  Chart creation errors have been sought, but may not always  have been located. Such creation errors do not reflect on  the standard of medical care.

## 2020-06-24 LAB — ANA,IFA RA DIAG PNL W/RFLX TIT/PATN
Anti Nuclear Antibody (ANA): POSITIVE — AB
Cyclic Citrullin Peptide Ab: <16 UNITS
Rheumatoid fact SerPl-aCnc: <14 IU/mL (ref <14)

## 2020-06-24 LAB — ANTI-NUCLEAR AB-TITER (ANA TITER): ANA Titer 1: 1:40 {titer} — ABNORMAL HIGH

## 2020-07-05 ENCOUNTER — Other Ambulatory Visit: Payer: Self-pay

## 2020-07-05 ENCOUNTER — Ambulatory Visit: Payer: Self-pay

## 2020-07-05 ENCOUNTER — Encounter: Payer: Self-pay | Admitting: Rheumatology

## 2020-07-05 ENCOUNTER — Encounter: Payer: Self-pay | Admitting: Emergency Medicine

## 2020-07-05 ENCOUNTER — Ambulatory Visit (INDEPENDENT_AMBULATORY_CARE_PROVIDER_SITE_OTHER): Payer: Managed Care, Other (non HMO) | Admitting: Rheumatology

## 2020-07-05 VITALS — BP 156/90 | HR 69 | Resp 14 | Ht 58.5 in | Wt 144.0 lb

## 2020-07-05 DIAGNOSIS — I10 Essential (primary) hypertension: Secondary | ICD-10-CM

## 2020-07-05 DIAGNOSIS — M79642 Pain in left hand: Secondary | ICD-10-CM

## 2020-07-05 DIAGNOSIS — M79672 Pain in left foot: Secondary | ICD-10-CM

## 2020-07-05 DIAGNOSIS — M790 Rheumatism, unspecified: Secondary | ICD-10-CM

## 2020-07-05 DIAGNOSIS — M79641 Pain in right hand: Secondary | ICD-10-CM

## 2020-07-05 DIAGNOSIS — F411 Generalized anxiety disorder: Secondary | ICD-10-CM

## 2020-07-05 DIAGNOSIS — F32A Depression, unspecified: Secondary | ICD-10-CM

## 2020-07-05 DIAGNOSIS — M79671 Pain in right foot: Secondary | ICD-10-CM

## 2020-07-05 DIAGNOSIS — M2550 Pain in unspecified joint: Secondary | ICD-10-CM

## 2020-07-05 DIAGNOSIS — M542 Cervicalgia: Secondary | ICD-10-CM | POA: Diagnosis not present

## 2020-07-05 DIAGNOSIS — M791 Myalgia, unspecified site: Secondary | ICD-10-CM

## 2020-07-05 DIAGNOSIS — F5101 Primary insomnia: Secondary | ICD-10-CM

## 2020-07-05 DIAGNOSIS — R5383 Other fatigue: Secondary | ICD-10-CM

## 2020-07-05 NOTE — Telephone Encounter (Signed)
She needs to verbalize these concerns to Dr. Fatima Sanger office staff.  They saw her in the office today and should be able to explain what they did.  Thanks.

## 2020-07-05 NOTE — Patient Instructions (Signed)
Hand Exercises Hand exercises can be helpful for almost anyone. These exercises can strengthen the hands, improve flexibility and movement, and increase blood flow to the hands. These results can make work and daily tasks easier. Hand exercises can be especially helpful for people who have joint pain from arthritis or have nerve damage from overuse (carpal tunnel syndrome). These exercises can also help people who have injured a hand. Exercises Most of these hand exercises are gentle stretching and motion exercises. It is usually safe to do them often throughout the day. Warming up your hands before exercise may help to reduce stiffness. You can do this with gentle massage orby placing your hands in warm water for 10-15 minutes. It is normal to feel some stretching, pulling, tightness, or mild discomfort as you begin new exercises. This will gradually improve. Stop an exercise right away if you feel sudden, severe pain or your pain gets worse. Ask your healthcare provider which exercises are best for you. Knuckle bend or "claw" fist Stand or sit with your arm, hand, and all five fingers pointed straight up. Make sure to keep your wrist straight during the exercise. Gently bend your fingers down toward your palm until the tips of your fingers are touching the top of your palm. Keep your big knuckle straight and just bend the small knuckles in your fingers. Hold this position for __________ seconds. Straighten (extend) your fingers back to the starting position. Repeat this exercise 5-10 times with each hand. Full finger fist Stand or sit with your arm, hand, and all five fingers pointed straight up. Make sure to keep your wrist straight during the exercise. Gently bend your fingers into your palm until the tips of your fingers are touching the middle of your palm. Hold this position for __________ seconds. Extend your fingers back to the starting position, stretching every joint fully. Repeat this  exercise 5-10 times with each hand. Straight fist Stand or sit with your arm, hand, and all five fingers pointed straight up. Make sure to keep your wrist straight during the exercise. Gently bend your fingers at the big knuckle, where your fingers meet your hand, and the middle knuckle. Keep the knuckle at the tips of your fingers straight and try to touch the bottom of your palm. Hold this position for __________ seconds. Extend your fingers back to the starting position, stretching every joint fully. Repeat this exercise 5-10 times with each hand. Tabletop Stand or sit with your arm, hand, and all five fingers pointed straight up. Make sure to keep your wrist straight during the exercise. Gently bend your fingers at the big knuckle, where your fingers meet your hand, as far down as you can while keeping the small knuckles in your fingers straight. Think of forming a tabletop with your fingers. Hold this position for __________ seconds. Extend your fingers back to the starting position, stretching every joint fully. Repeat this exercise 5-10 times with each hand. Finger spread Place your hand flat on a table with your palm facing down. Make sure your wrist stays straight as you do this exercise. Spread your fingers and thumb apart from each other as far as you can until you feel a gentle stretch. Hold this position for __________ seconds. Bring your fingers and thumb tight together again. Hold this position for __________ seconds. Repeat this exercise 5-10 times with each hand. Making circles Stand or sit with your arm, hand, and all five fingers pointed straight up. Make sure to keep your   wrist straight during the exercise. Make a circle by touching the tip of your thumb to the tip of your index finger. Hold for __________ seconds. Then open your hand wide. Repeat this motion with your thumb and each finger on your hand. Repeat this exercise 5-10 times with each hand. Thumb motion Sit  with your forearm resting on a table and your wrist straight. Your thumb should be facing up toward the ceiling. Keep your fingers relaxed as you move your thumb. Lift your thumb up as high as you can toward the ceiling. Hold for __________ seconds. Bend your thumb across your palm as far as you can, reaching the tip of your thumb for the small finger (pinkie) side of your palm. Hold for __________ seconds. Repeat this exercise 5-10 times with each hand. Grip strengthening  Hold a stress ball or other soft ball in the middle of your hand. Slowly increase the pressure, squeezing the ball as much as you can without causing pain. Think of bringing the tips of your fingers into the middle of your palm. All of your finger joints should bend when doing this exercise. Hold your squeeze for __________ seconds, then relax. Repeat this exercise 5-10 times with each hand. Contact a health care provider if: Your hand pain or discomfort gets much worse when you do an exercise. Your hand pain or discomfort does not improve within 2 hours after you exercise. If you have any of these problems, stop doing these exercises right away. Do not do them again unless your health care provider says that you can. Get help right away if: You develop sudden, severe hand pain or swelling. If this happens, stop doing these exercises right away. Do not do them again unless your health care provider says that you can. This information is not intended to replace advice given to you by your health care provider. Make sure you discuss any questions you have with your healthcare provider. Document Revised: 04/10/2018 Document Reviewed: 12/19/2017 Elsevier Patient Education  2022 [employer] Cervical Strain and Sprain Rehab Ask your health care provider which exercises are safe for you. Do exercises exactly as told by your health care provider and adjust them as directed. It is normal to feel mild stretching, pulling, tightness,  or discomfort as you do these exercises. Stop right away if you feel sudden pain or your pain gets worse. Do not begin these exercises until told by your health care provider. Stretching and range-of-motion exercises Cervical side bending  Using good posture, sit on a stable chair or stand up. Without moving your shoulders, slowly tilt your left / right ear to your shoulder until you feel a stretch in the opposite side neck muscles. You should be looking straight ahead. Hold for __________ seconds. Repeat with the other side of your neck. Repeat __________ times. Complete this exercise __________ times a day. Cervical rotation  Using good posture, sit on a stable chair or stand up. Slowly turn your head to the side as if you are looking over your left / right shoulder. Keep your eyes level with the ground. Stop when you feel a stretch along the side and the back of your neck. Hold for __________ seconds. Repeat this by turning to your other side. Repeat __________ times. Complete this exercise __________ times a day. Thoracic extension and pectoral stretch Roll a towel or a small blanket so it is about 4 inches (10 cm) in diameter. Lie down on your back on a firm surface.  Put the towel lengthwise, under your spine in the middle of your back. It should not be under your shoulder blades. The towel should line up with your spine from your middle back to your lower back. Put your hands behind your head and let your elbows fall out to your sides. Hold for __________ seconds. Repeat __________ times. Complete this exercise __________ times a day. Strengthening exercises Isometric upper cervical flexion Lie on your back with a thin pillow behind your head and a small rolled-up towel under your neck. Gently tuck your chin toward your chest and nod your head down to look toward your feet. Do not lift your head off the pillow. Hold for __________ seconds. Release the tension slowly. Relax your  neck muscles completely before you repeat this exercise. Repeat __________ times. Complete this exercise __________ times a day. Isometric cervical extension  Stand about 6 inches (15 cm) away from a wall, with your back facing the wall. Place a soft object, about 6-8 inches (15-20 cm) in diameter, between the back of your head and the wall. A soft object could be a small pillow, a ball, or a folded towel. Gently tilt your head back and press into the soft object. Keep your jaw and forehead relaxed. Hold for __________ seconds. Release the tension slowly. Relax your neck muscles completely before you repeat this exercise. Repeat __________ times. Complete this exercise __________ times a day. Posture and body mechanics Body mechanics refers to the movements and positions of your body while you do your daily activities. Posture is part of body mechanics. Good posture and healthy body mechanics can help to relieve stress in your body's tissues and joints. Good posture means that your spine is in its natural S-curve position (your spine is neutral), your shoulders are pulled back slightly, and your head is not tipped forward. The following are general guidelines for applying improved posture andbody mechanics to your everyday activities. Sitting  When sitting, keep your spine neutral and keep your feet flat on the floor. Use a footrest, if necessary, and keep your thighs parallel to the floor. Avoid rounding your shoulders, and avoid tilting your head forward. When working at a desk or a computer, keep your desk at a height where your hands are slightly lower than your elbows. Slide your chair under your desk so you are close enough to maintain good posture. When working at a computer, place your monitor at a height where you are looking straight ahead and you do not have to tilt your head forward or downward to look at the screen.  Standing  When standing, keep your spine neutral and keep your feet  about hip-width apart. Keep a slight bend in your knees. Your ears, shoulders, and hips should line up. When you do a task in which you stand in one place for a long time, place one foot up on a stable object that is 2-4 inches (5-10 cm) high, such as a footstool. This helps keep your spine neutral.  Resting When lying down and resting, avoid positions that are most painful for you. Try to support your neck in a neutral position. You can use a contour pillow or asmall rolled-up towel. Your pillow should support your neck but not push on it. This information is not intended to replace advice given to you by your health care provider. Make sure you discuss any questions you have with your healthcare provider. Document Revised: 04/09/2018 Document Reviewed: 09/18/2017 Elsevier Patient Education  2022  [employer].

## 2020-07-07 NOTE — Telephone Encounter (Signed)
Called and spoke with pt, she verbalized understanding. 

## 2020-07-20 ENCOUNTER — Other Ambulatory Visit: Payer: Managed Care, Other (non HMO) | Admitting: Rheumatology

## 2020-07-26 NOTE — Progress Notes (Signed)
Office Visit Note  Patient: Tara Alvarado             Date of Birth: [birth year]           MRN: [id number]             PCP: Horald Pollen, MD Referring: Horald Pollen, * Visit Date: 08/08/2020 Occupation: '@GUAROCC' @  Subjective:  Pain in multiple joints and muscle.   History of Present Illness: Tara Alvarado is a 50 y.o. female with history of osteoarthritis.  She continues to have pain and discomfort in her hands and her feet.  She also has generalized pain from fibromyalgia.  She states that her hands become especially more painful when she is exposed to the colder temperatures.  She also has discomfort when she is shopping all day.  He continues to have discomfort in her feet.  She has to stand long hours at work.  She continues to have generalized pain from fibromyalgia.  Activities of Daily Living:  Patient reports morning stiffness for 2-3 hours.   Patient Reports nocturnal pain.  Difficulty dressing/grooming: Reports Difficulty climbing stairs: Reports Difficulty getting out of chair: Denies Difficulty using hands for taps, buttons, cutlery, and/or writing: Reports  Review of Systems  Constitutional:  Positive for fatigue.  HENT:  Negative for mouth sores, mouth dryness and nose dryness.   Eyes:  Negative for pain, itching and dryness.  Respiratory:  Negative for shortness of breath and difficulty breathing.   Cardiovascular:  Negative for chest pain and palpitations.  Gastrointestinal:  Negative for blood in stool, constipation and diarrhea.  Endocrine: Negative for increased urination.  Genitourinary:  Negative for difficulty urinating.  Musculoskeletal:  Positive for joint pain, joint pain, myalgias, morning stiffness, muscle tenderness and myalgias. Negative for joint swelling.  Skin:  Negative for color change, rash and redness.  Allergic/Immunologic: Positive for susceptible to infections.  Neurological:  Positive for numbness, headaches and memory  loss. Negative for dizziness.  Hematological:  Positive for bruising/bleeding tendency.  Psychiatric/Behavioral:  Positive for confusion.    PMFS History:  Patient Active Problem List   Diagnosis Date Noted   Rheumatism 06/21/2020   Generalized anxiety disorder 12/09/2019   Chronic depression 12/09/2019   Essential hypertension 12/09/2019    Past Medical History:  Diagnosis Date   Allergy    seasonal   Anxiety    Hyperlipidemia    Not currently being tx.     Family History  Problem Relation Age of Onset   Heart disease Mother 33       AMI/CAD   Cancer Mother        uterus   Colon polyps Mother 1   Heart disease Father 33       AMI x 2   Hyperlipidemia Father    Cancer Father        kidney   Hypertension Sister    Heart disease Paternal Grandmother    Cancer Paternal Grandmother        stomach   Stomach cancer Paternal Grandmother 51   CAD Other    Cancer Other        breast   Healthy Son    Colon polyps Daughter    Colon cancer Neg Hx    Esophageal cancer Neg Hx    Rectal cancer Neg Hx    Past Surgical History:  Procedure Laterality Date   CESAREAN SECTION     OVARIAN CYST SURGERY     Social History  Social History Narrative   Not on file   Immunization History  Administered Date(s) Administered   PFIZER(Purple Top)SARS-COV-2 Vaccination 09/09/2019, 10/04/2019     Objective: Vital Signs: BP (!) 147/88 (BP Location: Left Arm, Patient Position: Sitting, Cuff Size: Normal)   Pulse 82   Ht '4\' 9"'  (1.448 m)   Wt 145 lb (65.8 kg)   BMI 31.38 kg/m    Physical Exam Vitals and nursing note reviewed.  Constitutional:      Appearance: She is well-developed.  HENT:     Head: Normocephalic and atraumatic.  Eyes:     Conjunctiva/sclera: Conjunctivae normal.  Cardiovascular:     Rate and Rhythm: Normal rate and regular rhythm.     Heart sounds: Normal heart sounds.  Pulmonary:     Effort: Pulmonary effort is normal.     Breath sounds: Normal breath  sounds.  Abdominal:     General: Bowel sounds are normal.     Palpations: Abdomen is soft.  Musculoskeletal:     Cervical back: Normal range of motion.  Lymphadenopathy:     Cervical: No cervical adenopathy.  Skin:    General: Skin is warm and dry.     Capillary Refill: Capillary refill takes less than 2 seconds.  Neurological:     Mental Status: She is alert and oriented to person, place, and time.  Psychiatric:        Behavior: Behavior normal.     Musculoskeletal Exam: C-spine thoracic and lumbar spine were in good range of motion.  She has mild PIP and DIP thickening in her hands.  Shoulder joints and elbow joints are in good range of motion with no synovitis.  Hip joints with good range of motion.  She tenderness over left trochanteric bursa.  Knee joints are in good range of motion.  There was no tenderness over ankles and MTPs.  She generalized hyperalgesia and positive tender points.  CDAI Exam: CDAI Score: -- Patient Global: --; Provider Global: -- Swollen: --; Tender: -- Joint Exam 08/08/2020   No joint exam has been documented for this visit   There is currently no information documented on the homunculus. Go to the Rheumatology activity and complete the homunculus joint exam.  Investigation: No additional findings.  Imaging: No results found.  Recent Labs: Lab Results  Component Value Date   WBC 5.2 06/21/2020   HGB 13.9 06/21/2020   PLT 256.0 06/21/2020   NA 137 06/21/2020   K 4.0 06/21/2020   CL 100 06/21/2020   CO2 30 06/21/2020   GLUCOSE 83 06/21/2020   BUN 12 06/21/2020   CREATININE 0.71 06/21/2020   BILITOT 0.6 06/21/2020   ALKPHOS 112 06/21/2020   AST 22 06/21/2020   ALT 26 06/21/2020   PROT 7.8 06/21/2020   ALBUMIN 4.7 06/21/2020   CALCIUM 10.1 06/21/2020   GFRAA 94 09/03/2019    June 21, 2020 ANA 1: 40NS, RF negative, anti-CCP negative, ESR 12, TSH normal, hemoglobin A1c 5.9, CK 96   Speciality Comments: No specialty comments  available.  Procedures:  No procedures performed Allergies: Patient has no known allergies.   Assessment / Plan:     Visit Diagnoses: Primary osteoarthritis of both hands - No synovitis was noted.  X-rays showed early changes of osteoarthritis.  X-ray findings were discussed with the patient.  Joint protection was discussed.  A handout on hand exercises was given.  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  I gave her a work note to avoid exposure to colder temperatures and also to avoid chopping and cutting.  No synovitis was noted.  Primary osteoarthritis of both feet - No synovitis was noted.  X-ray showed early osteoarthritis.  X-ray findings were discussed.  Proper fitting shoes were discussed.  Trochanteric bursitis, left hip-she had tenderness on palpation of her left trochanteric bursa.  A handout on IT band stretches were given.  I also offered physical therapy which she declined.  DDD (degenerative disc disease), cervical-  Fibromyalgia - History of pain in multiple joints and muscles over the years.  She had hyperalgesia and positive tender points.  Detailed counseling was provided.  I advised her to discuss possible low-dose Cymbalta with her PCP.  Need for regular exercise and stretching was emphasized.  She would benefit from water aerobics.  Other fatigue - Due to insomnia.  She also works long hours  Primary insomnia - she is on Melatonin.  Essential hypertension-she was advised to monitor blood pressure closely.  Her blood pressure was elevated today.  Chronic depression - History of intolerance to Cymbalta and no benefit from Wellbutrin.  Generalized anxiety disorder  Orders: No orders of the defined types were placed in this encounter.  No orders of the defined types were placed in this encounter.    Follow-Up Instructions: Return if symptoms worsen or fail to improve, for OA.   Bo Merino, MD  Note - This  record has been created using Editor, commissioning.  Chart creation errors have been sought, but may not always  have been located. Such creation errors do not reflect on  the standard of medical care.

## 2020-08-01 ENCOUNTER — Ambulatory Visit: Payer: Managed Care, Other (non HMO) | Admitting: Rheumatology

## 2020-08-02 ENCOUNTER — Ambulatory Visit: Payer: Managed Care, Other (non HMO) | Admitting: Rheumatology

## 2020-08-08 ENCOUNTER — Other Ambulatory Visit: Payer: Self-pay

## 2020-08-08 ENCOUNTER — Encounter: Payer: Self-pay | Admitting: Rheumatology

## 2020-08-08 ENCOUNTER — Ambulatory Visit (INDEPENDENT_AMBULATORY_CARE_PROVIDER_SITE_OTHER): Payer: Managed Care, Other (non HMO) | Admitting: Rheumatology

## 2020-08-08 VITALS — BP 147/88 | HR 82 | Ht <= 58 in | Wt 145.0 lb

## 2020-08-08 DIAGNOSIS — M19042 Primary osteoarthritis, left hand: Secondary | ICD-10-CM

## 2020-08-08 DIAGNOSIS — M5030 Other cervical disc degeneration, unspecified cervical region: Secondary | ICD-10-CM | POA: Diagnosis not present

## 2020-08-08 DIAGNOSIS — M19041 Primary osteoarthritis, right hand: Secondary | ICD-10-CM | POA: Diagnosis not present

## 2020-08-08 DIAGNOSIS — M797 Fibromyalgia: Secondary | ICD-10-CM

## 2020-08-08 DIAGNOSIS — F411 Generalized anxiety disorder: Secondary | ICD-10-CM

## 2020-08-08 DIAGNOSIS — M19071 Primary osteoarthritis, right ankle and foot: Secondary | ICD-10-CM | POA: Diagnosis not present

## 2020-08-08 DIAGNOSIS — M7062 Trochanteric bursitis, left hip: Secondary | ICD-10-CM | POA: Diagnosis not present

## 2020-08-08 DIAGNOSIS — F5101 Primary insomnia: Secondary | ICD-10-CM

## 2020-08-08 DIAGNOSIS — F32A Depression, unspecified: Secondary | ICD-10-CM

## 2020-08-08 DIAGNOSIS — I10 Essential (primary) hypertension: Secondary | ICD-10-CM

## 2020-08-08 DIAGNOSIS — M19072 Primary osteoarthritis, left ankle and foot: Secondary | ICD-10-CM

## 2020-08-08 DIAGNOSIS — R5383 Other fatigue: Secondary | ICD-10-CM

## 2020-08-08 NOTE — Patient Instructions (Addendum)
Iliotibial Band Syndrome Rehab Ask your health care provider which exercises are safe for you. Do exercises exactly as told by your health care provider and adjust them as directed. It is normal to feel mild stretching, pulling, tightness, or discomfort as you do these exercises. Stop right away if you feel sudden pain or your pain gets significantly worse. Do not begin these exercises until told by your health care provider. Stretching and range-of-motion exercises These exercises warm up your muscles and joints and improve the movement andflexibility of your hip and pelvis. Quadriceps stretch, prone  Lie on your abdomen (prone position) on a firm surface, such as a bed or padded floor. Bend your left / right knee and reach back to hold your ankle or pant leg. If you cannot reach your ankle or pant leg, loop a belt around your foot and grab the belt instead. Gently pull your heel toward your buttocks. Your knee should not slide out to the side. You should feel a stretch in the front of your thigh and knee (quadriceps). Hold this position for __________ seconds. Repeat __________ times. Complete this exercise __________ times a day. Iliotibial band stretch An iliotibial band is a strong band of muscle tissue that runs from the outer side of your hip to the outer side of your thigh and knee. Lie on your side with your left / right leg in the top position. Bend both of your knees and grab your left / right ankle. Stretch out your bottom arm to help you balance. Slowly bring your top knee back so your thigh goes behind your trunk. Slowly lower your top leg toward the floor until you feel a gentle stretch on the outside of your left / right hip and thigh. If you do not feel a stretch and your knee will not fall farther, place the heel of your other foot on top of your knee and pull your knee down toward the floor with your foot. Hold this position for __________ seconds. Repeat __________ times.  Complete this exercise __________ times a day. Strengthening exercises These exercises build strength and endurance in your hip and pelvis. Enduranceis the ability to use your muscles for a long time, even after they get tired. Straight leg raises, side-lying This exercise strengthens the muscles that rotate the leg at the hip and move it away from your body (hip abductors). Lie on your side with your left / right leg in the top position. Lie so your head, shoulder, hip, and knee line up. You may bend your bottom knee to help you balance. Roll your hips slightly forward so your hips are stacked directly over each other and your left / right knee is facing forward. Tense the muscles in your outer thigh and lift your top leg 4-6 inches (10-15 cm). Hold this position for __________ seconds. Slowly lower your leg to return to the starting position. Let your muscles relax completely before doing another repetition. Repeat __________ times. Complete this exercise __________ times a day. Leg raises, prone This exercise strengthens the muscles that move the hips backward (hip extensors). Lie on your abdomen (prone position) on your bed or a firm surface. You can put a pillow under your hips if that is more comfortable for your lower back. Bend your left / right knee so your foot is straight up in the air. Squeeze your buttocks muscles and lift your left / right thigh off the bed. Do not let your back arch. Tense your thigh  muscle as hard as you can without increasing any knee pain. Hold this position for __________ seconds. Slowly lower your leg to return to the starting position and allow it to relax completely. Repeat __________ times. Complete this exercise __________ times a day. Hip hike Stand sideways on a bottom step. Stand on your left / right leg with your other foot unsupported next to the step. You can hold on to a railing or wall for balance if needed. Keep your knees straight and your  torso square. Then lift your left / right hip up toward the ceiling. Slowly let your left / right hip lower toward the floor, past the starting position. Your foot should get closer to the floor. Do not lean or bend your knees. Repeat __________ times. Complete this exercise __________ times a day. This information is not intended to replace advice given to you by your health care provider. Make sure you discuss any questions you have with your healthcare provider. Document Revised: 02/25/2019 Document Reviewed: 02/25/2019 Elsevier Patient Education  2022 [employer] Hand Exercises Hand exercises can be helpful for almost anyone. These exercises can strengthen the hands, improve flexibility and movement, and increase blood flow to the hands. These results can make work and daily tasks easier. Hand exercises can be especially helpful for people who have joint pain from arthritis or have nerve damage from overuse (carpal tunnel syndrome). These exercises can also help people who have injured a hand. Most of these hand exercises are gentle stretching and motion exercises. It is usually safe to do them often throughout the day. Warming up your hands before exercise may help to reduce stiffness. You can do this with gentle massage orby placing your hands in warm water for 10-15 minutes. It is normal to feel some stretching, pulling, tightness, or mild discomfort as you begin new exercises. This will gradually improve. Stop an exercise right away if you feel sudden, severe pain or your pain gets worse. Ask your healthcare provider which exercises are best for you. Knuckle bend or "claw" fist Stand or sit with your arm, hand, and all five fingers pointed straight up. Make sure to keep your wrist straight during the exercise. Gently bend your fingers down toward your palm until the tips of your fingers are touching the top of your palm. Keep your big knuckle straight and just bend the small knuckles in your  fingers. Hold this position for __________ seconds. Straighten (extend) your fingers back to the starting position. Repeat this exercise 5-10 times with each hand. Full finger fist Stand or sit with your arm, hand, and all five fingers pointed straight up. Make sure to keep your wrist straight during the exercise. Gently bend your fingers into your palm until the tips of your fingers are touching the middle of your palm. Hold this position for __________ seconds. Extend your fingers back to the starting position, stretching every joint fully. Repeat this exercise 5-10 times with each hand. Straight fist Stand or sit with your arm, hand, and all five fingers pointed straight up. Make sure to keep your wrist straight during the exercise. Gently bend your fingers at the big knuckle, where your fingers meet your hand, and the middle knuckle. Keep the knuckle at the tips of your fingers straight and try to touch the bottom of your palm. Hold this position for __________ seconds. Extend your fingers back to the starting position, stretching every joint fully. Repeat this exercise 5-10 times with each hand. Tabletop Stand  or sit with your arm, hand, and all five fingers pointed straight up. Make sure to keep your wrist straight during the exercise. Gently bend your fingers at the big knuckle, where your fingers meet your hand, as far down as you can while keeping the small knuckles in your fingers straight. Think of forming a tabletop with your fingers. Hold this position for __________ seconds. Extend your fingers back to the starting position, stretching every joint fully. Repeat this exercise 5-10 times with each hand. Finger spread Place your hand flat on a table with your palm facing down. Make sure your wrist stays straight as you do this exercise. Spread your fingers and thumb apart from each other as far as you can until you feel a gentle stretch. Hold this position for __________  seconds. Bring your fingers and thumb tight together again. Hold this position for __________ seconds. Repeat this exercise 5-10 times with each hand. Making circles Stand or sit with your arm, hand, and all five fingers pointed straight up. Make sure to keep your wrist straight during the exercise. Make a circle by touching the tip of your thumb to the tip of your index finger. Hold for __________ seconds. Then open your hand wide. Repeat this motion with your thumb and each finger on your hand. Repeat this exercise 5-10 times with each hand. Thumb motion Sit with your forearm resting on a table and your wrist straight. Your thumb should be facing up toward the ceiling. Keep your fingers relaxed as you move your thumb. Lift your thumb up as high as you can toward the ceiling. Hold for __________ seconds. Bend your thumb across your palm as far as you can, reaching the tip of your thumb for the small finger (pinkie) side of your palm. Hold for __________ seconds. Repeat this exercise 5-10 times with each hand. Grip strengthening  Hold a stress ball or other soft ball in the middle of your hand. Slowly increase the pressure, squeezing the ball as much as you can without causing pain. Think of bringing the tips of your fingers into the middle of your palm. All of your finger joints should bend when doing this exercise. Hold your squeeze for __________ seconds, then relax. Repeat this exercise 5-10 times with each hand. Contact a health care provider if: Your hand pain or discomfort gets much worse when you do an exercise. Your hand pain or discomfort does not improve within 2 hours after you exercise. If you have any of these problems, stop doing these exercises right away. Do not do them again unless your health care provider says that you can. Get help right away if: You develop sudden, severe hand pain or swelling. If this happens, stop doing these exercises right away. Do not do them again  unless your health care provider says that you can. This information is not intended to replace advice given to you by your health care provider. Make sure you discuss any questions you have with your healthcare provider. Document Revised: 04/10/2018 Document Reviewed: 12/19/2017 Elsevier Patient Education  2022 [employer].

## 2020-11-10 ENCOUNTER — Encounter: Payer: Managed Care, Other (non HMO) | Admitting: Family Medicine

## 2020-12-04 ENCOUNTER — Emergency Department (HOSPITAL_COMMUNITY)
Admission: EM | Admit: 2020-12-04 | Discharge: 2020-12-04 | Disposition: A | Discharge status: Home / Self Care | Payer: Managed Care, Other (non HMO) | Attending: Emergency Medicine | Admitting: Emergency Medicine

## 2020-12-04 ENCOUNTER — Other Ambulatory Visit: Payer: Self-pay

## 2020-12-04 ENCOUNTER — Encounter (HOSPITAL_COMMUNITY): Payer: Self-pay | Admitting: Emergency Medicine

## 2020-12-04 DIAGNOSIS — R519 Headache, unspecified: Secondary | ICD-10-CM | POA: Diagnosis present

## 2020-12-04 DIAGNOSIS — J101 Influenza due to other identified influenza virus with other respiratory manifestations: Secondary | ICD-10-CM | POA: Insufficient documentation

## 2020-12-04 DIAGNOSIS — Z20822 Contact with and (suspected) exposure to covid-19: Secondary | ICD-10-CM | POA: Insufficient documentation

## 2020-12-04 DIAGNOSIS — Z87891 Personal history of nicotine dependence: Secondary | ICD-10-CM | POA: Diagnosis not present

## 2020-12-04 DIAGNOSIS — J111 Influenza due to unidentified influenza virus with other respiratory manifestations: Secondary | ICD-10-CM

## 2020-12-04 LAB — RESP PANEL BY RT-PCR (FLU A&B, COVID) ARPGX2
Influenza A by PCR: POSITIVE — AB
Influenza B by PCR: NEGATIVE
SARS Coronavirus 2 by RT PCR: NEGATIVE

## 2020-12-04 MED ORDER — BENZONATATE 100 MG PO CAPS: 100.0000 mg | ORAL_CAPSULE | Freq: Three times a day (TID) | ORAL | 0 refills | Status: DC

## 2020-12-04 NOTE — ED Triage Notes (Signed)
Patient complains of sore throat, cough and chest congestion, HA since Thursday. Denies fevers, denies productive cough. Tried IBU w/ a little relief.

## 2020-12-04 NOTE — ED Provider Notes (Signed)
[facility]-EMERGENCY DEPT Provider Note   CSN: [id number] Arrival date & time: 12/04/20  5956     History Chief Complaint  Patient presents with   Sore Throat   Headache   Cough    Tara Alvarado is a 50 y.o. female presents to the ED for evaluation of FLS for the past 4 days. The patient complains of HA, dry cough, nasal congestion, rhinorrhea, bilateral ear pain, sore throat, and post-tussive emesis. Denies any fevers, SOB, nausea, abdominal pain, or dysuria. She mentions chest pain with coughing, but none with deep breaths.  No history pertinent for hypertension and fibromyalgia.  Patient reports she is not taking any medications for her blood pressure as it is well controlled.  No known drug allergies.  She denies any tobacco, EtOH, or drug use.   Sore Throat Associated symptoms include headaches. Pertinent negatives include no chest pain, no abdominal pain and no shortness of breath.  Headache Associated symptoms: congestion, cough, ear pain, sore throat and vomiting   Associated symptoms: no abdominal pain, no back pain, no diarrhea, no fever, no myalgias, no nausea, no photophobia and no weakness   Cough Associated symptoms: ear pain, headaches, rhinorrhea and sore throat   Associated symptoms: no chest pain, no chills, no eye discharge, no fever, no myalgias, no rash and no shortness of breath       Past Medical History:  Diagnosis Date   Allergy    seasonal   Anxiety    Hyperlipidemia    Not currently being tx.     Patient Active Problem List   Diagnosis Date Noted   Rheumatism 06/21/2020   Generalized anxiety disorder 12/09/2019   Chronic depression 12/09/2019   Essential hypertension 12/09/2019    Past Surgical History:  Procedure Laterality Date   CESAREAN SECTION     OVARIAN CYST SURGERY       OB History   No obstetric history on file.     Family History  Problem Relation Age of Onset   Heart disease Mother 68       AMI/CAD    Cancer Mother        uterus   Colon polyps Mother 39   Heart disease Father 28       AMI x 2   Hyperlipidemia Father    Cancer Father        kidney   Hypertension Sister    Heart disease Paternal Grandmother    Cancer Paternal Grandmother        stomach   Stomach cancer Paternal Grandmother 88   CAD Other    Cancer Other        breast   Healthy Son    Colon polyps Daughter    Colon cancer Neg Hx    Esophageal cancer Neg Hx    Rectal cancer Neg Hx     Social History   Tobacco Use   Smoking status: Former   Smokeless tobacco: Never  Building services engineer Use: Never used  Substance Use Topics   Alcohol use: Yes    Comment: wine occ   Drug use: No    Home Medications Prior to Admission medications   Medication Sig Start Date End Date Taking? Authorizing Provider  benzonatate (TESSALON) 100 MG capsule Take 1 capsule (100 mg total) by mouth every 8 (eight) hours. 12/04/20  Yes Achille Rich, PA-C  Acetaminophen (TYLENOL PO) Take by mouth daily.    [provider]  amoxicillin (AMOXIL)  875 MG tablet Take 875 mg by mouth 2 (two) times daily. 08/07/20   [provider]  Cholecalciferol 25 MCG (1000 UT) capsule Vitamin D3 25 mcg (1,000 unit) capsule  Take 1 capsule every day by oral route. Patient not taking: Reported on 08/08/2020    [provider]  Ibuprofen (MOTRIN PO) Take by mouth.    [provider]    Allergies    Patient has no known allergies.  Review of Systems   Review of Systems  Constitutional:  Negative for chills and fever.  HENT:  Positive for congestion, ear pain, rhinorrhea and sore throat. Negative for drooling and trouble swallowing.   Eyes:  Negative for photophobia, discharge and visual disturbance.  Respiratory:  Positive for cough. Negative for shortness of breath.   Cardiovascular:  Negative for chest pain and palpitations.  Gastrointestinal:  Positive for vomiting. Negative for abdominal pain, diarrhea and  nausea.  Genitourinary:  Negative for dysuria and hematuria.  Musculoskeletal:  Negative for arthralgias, back pain, joint swelling and myalgias.  Skin:  Negative for color change and rash.  Neurological:  Positive for headaches. Negative for syncope and weakness.   Physical Exam Updated Vital Signs BP 117/80   Pulse 86   Temp 98.7 F (37.1 C) (Oral)   Resp 18   Ht 4\' 9"  (1.448 m)   Wt 61.2 kg   SpO2 100%   BMI 29.21 kg/m   Physical Exam Vitals and nursing note reviewed.  Constitutional:      General: She is not in acute distress.    Appearance: She is not toxic-appearing.  HENT:     Head: Normocephalic and atraumatic.     Right Ear: Tympanic membrane, ear canal and external ear normal.     Left Ear: Tympanic membrane, ear canal and external ear normal.     Nose:     Comments: Bilateral nasal turbinate edema and erythema with scant clear nasal discharge.    Mouth/Throat:     Mouth: Mucous membranes are moist.     Comments: No pharyngeal erythema, exudate, or edema noted.  Uvula midline.  Airway patent.  Moist mucous membranes. Eyes:     General: No scleral icterus.    Conjunctiva/sclera: Conjunctivae normal.  Cardiovascular:     Rate and Rhythm: Normal rate.  Pulmonary:     Effort: Pulmonary effort is normal. No respiratory distress.     Breath sounds: Normal breath sounds.  Abdominal:     General: Abdomen is flat.     Palpations: Abdomen is soft.  Musculoskeletal:        General: No deformity.     Cervical back: Normal range of motion.  Skin:    Findings: No rash.  Neurological:     General: No focal deficit present.     Mental Status: She is alert.    ED Results / Procedures / Treatments   Labs (all labs ordered are listed, but only abnormal results are displayed) Labs Reviewed  RESP PANEL BY RT-PCR (FLU A&B, COVID) ARPGX2 - Abnormal; Notable for the following components:      Result Value   Influenza A by PCR POSITIVE (*)    All other components within  normal limits    EKG None  Radiology No results found.  Procedures Procedures   Medications Ordered in ED Medications - No data to display  ED Course  I have reviewed the triage vital signs and the nursing notes.  Pertinent labs & imaging results that  were available during my care of the patient were reviewed by me and considered in my medical decision making (see chart for details).  50 y/o F presents to the ED for eval of FLS for the past 4 days. Vital signs are stable.  Patient is normotensive, normal heart rate, satting high percent on room air, and is not tachypneic.  Afebrile.  On physical exam, patient has some nasal congestion present.  No pharyngeal erythema, edema, or exudate noted.  Bilateral TMs show good light reflex with no signs of infection.  Lungs clear to auscultation bilaterally.  Patient does have positive for influenza A, negative for COVID.  Supportive care measures discussed such as staying well-hydrated, and obtaining plenty rest.  Recommended the patient try over-the-counter cough cold medications for relief of symptoms.  Additionally, will prescribe patient Tessalon Perles for cough.  Patient is not a candidate for Tamiflu given the duration of symptoms.  Return precautions discussed.  Patient agrees to plan.  Work note provided.  Patient is stable being discharged home in good condition.    MDM Rules/Calculators/A&P                          Final Clinical Impression(s) / ED Diagnoses Final diagnoses:  Flu    Rx / DC Orders ED Discharge Orders          Ordered    benzonatate (TESSALON) 100 MG capsule  Every 8 hours        12/04/20 1235             Sherrell Puller, [place] 12/04/20 1235    Dorie Rank, MD 12/04/20 1525

## 2020-12-04 NOTE — ED Provider Notes (Signed)
Emergency Medicine Provider Triage Evaluation Note  Tara Alvarado , a 50 y.o. female  was evaluated in triage.  Pt complains of cough, congestion, HA and vomiting for the last three days. No new sick contacts. .  Review of Systems  Positive: Cough, congestion, HA Negative: Fevers, chills, diarrhea, SOB  Physical Exam  BP 123/86 (BP Location: Left Arm)   Pulse 89   Temp 98.7 F (37.1 C) (Oral)   Resp 18   Ht 4\' 9"  (1.448 m)   Wt 61.2 kg   SpO2 95%   BMI 29.21 kg/m  Gen:   Awake, no distress, ill appearing Resp:  Normal effort, clear to auscultation bilaterally MSK:   Moves extremities without difficulty  Other:    Medical Decision Making  Medically screening exam initiated at 7:49 AM.  Appropriate orders placed.  Korina Tretter was informed that the remainder of the evaluation will be completed by another provider, this initial triage assessment does not replace that evaluation, and the importance of remaining in the ED until their evaluation is complete.     Tiffany Kocher, Janell Quiet 12/04/20 0750    14/04/22, MD 12/04/20 1525

## 2020-12-04 NOTE — Discharge Instructions (Addendum)
You were seen here today for your flulike symptoms.  You were diagnosed with the flu, negative for COVID.  Since you have had the symptoms for over 48 hours, you are not a candidate to receive the antiviral medication Tamiflu.  You have been prescribed Tessalon Perles, and anticough medication to take as needed.  Additionally, please take over-the-counter cough cold medication to help with your other symptoms.  More importantly, please remain well-hydrated with fluids, mainly water and stay well rested.  Work note has been provided for you.  Additional information on the flu included in this discharge paperwork.  If you have any concern, new or worsening symptoms, please return the nearest emergency department for evaluation.

## 2021-07-31 NOTE — Progress Notes (Unsigned)
Erroneous encounter

## 2022-02-14 DIAGNOSIS — R072 Precordial pain: Secondary | ICD-10-CM | POA: Insufficient documentation

## 2022-02-14 NOTE — Progress Notes (Signed)
Patient referred by Georgina Quint, * for chest pain  Subjective:   Tara Alvarado, female    DOB: 1970/01/22, 52 y.o.   MRN: [id number]   Chief Complaint  Patient presents with   Chest Pain   New Patient (Initial Visit)     HPI  52 y.o.  female with hyperlipidemia, family h/o CAD with chest pain.  Patient works in a Clinical research associate. She does not do any regular exercise but stays busy at work.  She has been having episodes of chest pain and shortness of breath for a while. Pain is retrosternal, not particularly related to exertion, more in the latter half of the day associated with fatigue. She does endorse snoring and apnea during sleep, as noted by her husband. Her cholesterol has always been elevated. She does have family h/o CAD, mother had MI in 50s.    Past Medical History:  Diagnosis Date   Allergy    seasonal   Anxiety    Hyperlipidemia    Not currently being tx.      Past Surgical History:  Procedure Laterality Date   CESAREAN SECTION     OVARIAN CYST SURGERY       Social History   Tobacco Use  Smoking Status Former  Smokeless Tobacco Never    Social History   Substance and Sexual Activity  Alcohol Use Yes   Comment: wine occ     Family History  Problem Relation Age of Onset   Heart disease Mother 66       AMI/CAD   Cancer Mother        uterus   Colon polyps Mother 94   Heart disease Father 48       AMI x 2   Hyperlipidemia Father    Cancer Father        kidney   Hypertension Sister    Heart disease Paternal Grandmother    Cancer Paternal Grandmother        stomach   Stomach cancer Paternal Grandmother 45   CAD Other    Cancer Other        breast   Healthy Son    Colon polyps Daughter    Colon cancer Neg Hx    Esophageal cancer Neg Hx    Rectal cancer Neg Hx       Current Outpatient Medications:    Acetaminophen (TYLENOL PO), Take by mouth daily., Disp: , Rfl:    amoxicillin (AMOXIL) 875 MG tablet, Take 875 mg by mouth 2  (two) times daily., Disp: , Rfl:    benzonatate (TESSALON) 100 MG capsule, Take 1 capsule (100 mg total) by mouth every 8 (eight) hours., Disp: 21 capsule, Rfl: 0   Cholecalciferol 25 MCG (1000 UT) capsule, Vitamin D3 25 mcg (1,000 unit) capsule  Take 1 capsule every day by oral route. (Patient not taking: Reported on 08/08/2020), Disp: , Rfl:    Ibuprofen (MOTRIN PO), Take by mouth., Disp: , Rfl:    Cardiovascular and other pertinent studies:  Reviewed external labs and tests, independently interpreted  EKG 02/15/2022: Sinus rhythm 66 bpm Nonspecific T-abnormality   Recent labs: Jan-April 2023: Glucose 80, BUN/Cr 13/0.78. EGFR 91. Na/K 140/4.7. AlKP 136. ALT 35. Rest of the CMP normal H/H 14/46. MCV 88. Platelets 279 HbA1C 5.8% Chol 228, TG 84, HDL 74, LDL 139 TSH 1.5 normal   Review of Systems  Cardiovascular:  Positive for chest pain and dyspnea on exertion. Negative for leg swelling, palpitations and syncope.  Vitals:   02/15/22 0901  BP: 125/80  Pulse: 67  SpO2: 97%     Body mass index is 31.77 kg/m. Filed Weights   02/15/22 0901  Weight: 146 lb 12.8 oz (66.6 kg)     Objective:   Physical Exam Vitals and nursing note reviewed.  Constitutional:      General: She is not in acute distress. Neck:     Vascular: No JVD.  Cardiovascular:     Rate and Rhythm: Normal rate and regular rhythm.     Heart sounds: Normal heart sounds. No murmur heard. Pulmonary:     Effort: Pulmonary effort is normal.     Breath sounds: Normal breath sounds. No wheezing or rales.  Musculoskeletal:     Right lower leg: No edema.     Left lower leg: No edema.          Visit diagnoses:   ICD-10-CM   1. Precordial pain  R07.2 EKG 12-Lead    aspirin EC 81 MG tablet    nitroGLYCERIN (NITROSTAT) 0.4 MG SL tablet    atorvastatin (LIPITOR) 20 MG tablet    PCV ECHOCARDIOGRAM COMPLETE    PCV MYOCARDIAL PERFUSION WO LEXISCAN    CT CARDIAC SCORING (SELF PAY ONLY)    2.  Family history of early CAD  Z58.49 CT CARDIAC SCORING (SELF PAY ONLY)    3. Snoring  R06.83 Ambulatory referral to Sleep Studies    4. Fatigue, unspecified type  R53.83 Ambulatory referral to Sleep Studies       Orders Placed This Encounter  Procedures   CT CARDIAC SCORING (SELF PAY ONLY)   Ambulatory referral to Sleep Studies   PCV MYOCARDIAL PERFUSION WO LEXISCAN   EKG 12-Lead   PCV ECHOCARDIOGRAM COMPLETE       Meds ordered this encounter  Medications   aspirin EC 81 MG tablet    Sig: Take 1 tablet (81 mg total) by mouth daily. Swallow whole.    Dispense:  30 tablet    Refill:  1   nitroGLYCERIN (NITROSTAT) 0.4 MG SL tablet    Sig: Place 1 tablet (0.4 mg total) under the tongue every 5 (five) minutes as needed for chest pain.    Dispense:  30 tablet    Refill:  1   atorvastatin (LIPITOR) 20 MG tablet    Sig: Take 1 tablet (20 mg total) by mouth daily.    Dispense:  90 tablet    Refill:  3     Assessment & Recommendations:   52 y.o.  female with hyperlipidemia, family h/o CAD with chest pain.  Chest pain: Atypical pain, but has risk factors for CAD. Recommend exercise nuclear stress test, echocardiogram, CT cardiac scoring.  Until further work up is complete, recommend Aspirin 81 mg daily.   Mixed hyperlipidemia: In addition to diet and lifestyle changes, reasonable to start lipitor 20 mg daily.  Further recommendations after above testing  Thank you for referring the patient to [place]. Please feel free to contact with any questions.   Elder Negus, MD Pager: [phone] Office: [phone]

## 2022-02-15 ENCOUNTER — Encounter: Payer: Self-pay | Admitting: Cardiology

## 2022-02-15 ENCOUNTER — Ambulatory Visit: Payer: 59 | Admitting: Cardiology

## 2022-02-15 VITALS — BP 125/80 | HR 67 | Ht <= 58 in | Wt 146.8 lb

## 2022-02-15 DIAGNOSIS — R0683 Snoring: Secondary | ICD-10-CM

## 2022-02-15 DIAGNOSIS — R5383 Other fatigue: Secondary | ICD-10-CM

## 2022-02-15 DIAGNOSIS — R072 Precordial pain: Secondary | ICD-10-CM

## 2022-02-15 DIAGNOSIS — Z8249 Family history of ischemic heart disease and other diseases of the circulatory system: Secondary | ICD-10-CM | POA: Insufficient documentation

## 2022-02-15 MED ORDER — ATORVASTATIN CALCIUM 20 MG PO TABS
20.0000 mg | ORAL_TABLET | Freq: Every day | ORAL | 3 refills | Status: AC
Start: 2022-02-15 — End: 2023-02-10

## 2022-02-15 MED ORDER — NITROGLYCERIN 0.4 MG SL SUBL: 0.4000 mg | SUBLINGUAL_TABLET | SUBLINGUAL | 1 refills | Status: AC | PRN

## 2022-02-15 MED ORDER — ASPIRIN 81 MG PO TBEC: 81.0000 mg | DELAYED_RELEASE_TABLET | Freq: Every day | ORAL | 1 refills | Status: DC

## 2022-02-21 ENCOUNTER — Ambulatory Visit: Payer: 59

## 2022-02-21 DIAGNOSIS — R072 Precordial pain: Secondary | ICD-10-CM

## 2022-02-27 ENCOUNTER — Ambulatory Visit: Payer: 59

## 2022-02-27 DIAGNOSIS — R072 Precordial pain: Secondary | ICD-10-CM

## 2022-03-01 NOTE — Progress Notes (Signed)
I tried calling the patient, went to voicemail., She may be at work.  Briefly, I personally reviewed her echocardiogram and stress test. I think her heart function is lower end of normal, but not abnormal. Stress test does show mild abnormality. At this time, I recommend starting a medication called metoprolol succinate 25 mg daily to see if her symptoms improve. Will discuss further during her upcoming office visit in couple weeks.  If patient is agreeable, please send her metoprolol succinate 25 mg 30 tabsX2 refills.  Thanks MJP

## 2022-03-02 NOTE — Progress Notes (Signed)
Spoke with patient, patient agrees to take Metoprolol '25mg'$ .

## 2022-03-08 ENCOUNTER — Other Ambulatory Visit: Payer: Self-pay

## 2022-03-08 MED ORDER — METOPROLOL SUCCINATE ER 25 MG PO TB24: 25.0000 mg | ORAL_TABLET | Freq: Every day | ORAL | 3 refills | Status: DC

## 2022-03-09 ENCOUNTER — Other Ambulatory Visit: Payer: Self-pay | Admitting: Cardiology

## 2022-03-09 DIAGNOSIS — R072 Precordial pain: Secondary | ICD-10-CM

## 2022-03-29 ENCOUNTER — Encounter: Payer: Self-pay | Admitting: Cardiology

## 2022-03-29 ENCOUNTER — Ambulatory Visit: Payer: 59 | Admitting: Cardiology

## 2022-03-29 VITALS — BP 152/92 | HR 74 | Resp 16 | Ht <= 58 in | Wt 145.8 lb

## 2022-03-29 DIAGNOSIS — I10 Essential (primary) hypertension: Secondary | ICD-10-CM

## 2022-03-29 DIAGNOSIS — R072 Precordial pain: Secondary | ICD-10-CM

## 2022-03-29 DIAGNOSIS — E782 Mixed hyperlipidemia: Secondary | ICD-10-CM

## 2022-03-29 MED ORDER — LISINOPRIL 10 MG PO TABS: 10.0000 mg | ORAL_TABLET | Freq: Every day | ORAL | 0 refills | Status: AC

## 2022-03-29 NOTE — Progress Notes (Signed)
Patient referred by Horald Pollen, * for chest pain  Subjective:   Tara Alvarado, female    DOB: 1970/09/15, 52 y.o.   MRN: [id number]   Chief Complaint  Patient presents with   Chest Pain   Follow-up    4-6 weeks     HPI  52 y.o.  female with hyperlipidemia, family h/o CAD with chest pain.  Patient's chest pain is better, but occurs at times after long work hours.  She did not tolerate metoprolol due to lightheadedness.  She is able to tolerate lisinopril, but is running out.  Initial consultation visit 02/2022: Patient works in a Forensic psychologist. She does not do any regular exercise but stays busy at work.  She has been having episodes of chest pain and shortness of breath for a while. Pain is retrosternal, not particularly related to exertion, more in the latter half of the day associated with fatigue. She does endorse snoring and apnea during sleep, as noted by her husband. Her cholesterol has always been elevated. She does have family h/o CAD, mother had MI in 41s.     Current Outpatient Medications:    ASPIRIN LOW DOSE 81 MG tablet, TAKE 1 TABLET (81 MG TOTAL) BY MOUTH DAILY. SWALLOW WHOLE., Disp: 90 tablet, Rfl: 1   atorvastatin (LIPITOR) 20 MG tablet, Take 1 tablet (20 mg total) by mouth daily., Disp: 90 tablet, Rfl: 3   lisinopril (ZESTRIL) 10 MG tablet, Take 10 mg by mouth daily., Disp: , Rfl:    metoprolol succinate (TOPROL XL) 25 MG 24 hr tablet, Take 1 tablet (25 mg total) by mouth daily., Disp: 30 tablet, Rfl: 3   nitroGLYCERIN (NITROSTAT) 0.4 MG SL tablet, Place 1 tablet (0.4 mg total) under the tongue every 5 (five) minutes as needed for chest pain., Disp: 30 tablet, Rfl: 1   Cardiovascular and other pertinent studies:  Reviewed external labs and tests, independently interpreted  EKG 02/15/2022: Sinus rhythm 66 bpm Nonspecific T-abnormality  Exercise Sestamibi stress test 02/27/2022: Exercise nuclear stress test was performed using Bruce protocol. Patient  reached 9.3 METS, and 86% of age predicted maximum heart rate. Exercise capacity was good. Normal chest pain reported. Heart rate and hemodynamic response were normal. Stress EKG revealed no ischemic changes. SPECT images show small sized, mild intensity, partially reversible perfusion defect in mid to basal inferior/inferoseptal myocardium. Stress LVEF 50%. Low risk study.   Echocardiogram 02/21/2022:  Mildly depressed LV systolic function with visual EF 45-50%. Left  ventricle cavity is normal in size. Normal left ventricular wall  thickness. Hypokinetic global wall motion. Doppler evidence of grade I  (impaired) diastolic dysfunction. Calculated EF 44%.  Structurally normal tricuspid valve with no regurgitation. No evidence of  pulmonary hypertension.  No prior available for comparison.   CT cardiac scoring 02/21/2022: Calcium score 0    Recent labs: Jan-April 2023: Glucose 80, BUN/Cr 13/0.78. EGFR 91. Na/K 140/4.7. AlKP 136. ALT 35. Rest of the CMP normal H/H 14/46. MCV 88. Platelets 279 HbA1C 5.8% Chol 228, TG 84, HDL 74, LDL 139 TSH 1.5 normal   Review of Systems  Cardiovascular:  Positive for chest pain and dyspnea on exertion. Negative for leg swelling, palpitations and syncope.         There were no vitals filed for this visit.    There is no height or weight on file to calculate BMI. There were no vitals filed for this visit.    Objective:   Physical Exam Vitals and nursing note  reviewed.  Constitutional:      General: She is not in acute distress. Neck:     Vascular: No JVD.  Cardiovascular:     Rate and Rhythm: Normal rate and regular rhythm.     Heart sounds: Normal heart sounds. No murmur heard. Pulmonary:     Effort: Pulmonary effort is normal.     Breath sounds: Normal breath sounds. No wheezing or rales.  Musculoskeletal:     Right lower leg: No edema.     Left lower leg: No edema.          Visit diagnoses:   ICD-10-CM   1. Precordial  pain  R07.2     2. Essential hypertension  I10     3. Mixed hyperlipidemia  E78.2         Meds ordered this encounter  Medications   lisinopril (ZESTRIL) 10 MG tablet    Sig: Take 1 tablet (10 mg total) by mouth daily.    Dispense:  90 tablet    Refill:  0     Assessment & Recommendations:   52 y.o. female with hyperlipidemia, family h/o CAD with chest pain.  Chest pain: Atypical pain, but has risk factors for CAD. I personally reviewed echocardiogram. EF appears low normal 50-55% Good exercise capacity with 9.3 minutes, with small sized, mild intensity, partially reversible perfusion defect in mid to basal inferior/inferoseptal myocardium. Stress LVEF 50%. (02/2022). Calcium score 0 makes ischemia and ischemic cardiomyopathy highly unlikely. Small possibility is that she could have microvascular dysfunction, which could cause chest pain, abnormal stress test, without any significant epicardial coronary artery disease. Okay to stop Aspirin. Continue statin. She is unable to tolerate metoprolol. Continue lisinopril. If symptoms persist, eventually she may need coronary angiography with CMD evaluation.  F/u in 3 months   Nigel Mormon, MD Pager: [phone] Office: [phone]

## 2022-06-04 ENCOUNTER — Other Ambulatory Visit: Payer: Self-pay | Admitting: Cardiology

## 2022-11-14 ENCOUNTER — Other Ambulatory Visit: Payer: Self-pay

## 2022-11-14 ENCOUNTER — Encounter (HOSPITAL_COMMUNITY): Payer: Self-pay

## 2022-11-14 ENCOUNTER — Emergency Department (HOSPITAL_COMMUNITY): Payer: Self-pay

## 2022-11-14 ENCOUNTER — Emergency Department (HOSPITAL_COMMUNITY)
Admission: EM | Admit: 2022-11-14 | Discharge: 2022-11-14 | Disposition: A | Discharge status: Home / Self Care | Payer: Worker's Compensation | Attending: Emergency Medicine | Admitting: Emergency Medicine

## 2022-11-14 DIAGNOSIS — W208XXA Other cause of strike by thrown, projected or falling object, initial encounter: Secondary | ICD-10-CM | POA: Diagnosis not present

## 2022-11-14 DIAGNOSIS — Z79899 Other long term (current) drug therapy: Secondary | ICD-10-CM | POA: Diagnosis not present

## 2022-11-14 DIAGNOSIS — Y990 Civilian activity done for income or pay: Secondary | ICD-10-CM | POA: Diagnosis not present

## 2022-11-14 DIAGNOSIS — S0990XA Unspecified injury of head, initial encounter: Secondary | ICD-10-CM | POA: Insufficient documentation

## 2022-11-14 MED ORDER — ONDANSETRON 8 MG PO TBDP
8.0000 mg | ORAL_TABLET | Freq: Once | ORAL | Status: AC
  Administered 2022-11-14: 8 mg via ORAL
  Filled 2022-11-14: qty 1

## 2022-11-14 MED ORDER — KETOROLAC TROMETHAMINE 15 MG/ML IJ SOLN
15.0000 mg | Freq: Once | INTRAMUSCULAR | Status: AC
  Administered 2022-11-14: 15 mg via INTRAMUSCULAR
  Filled 2022-11-14: qty 1

## 2022-11-14 NOTE — ED Provider Notes (Signed)
I received this patient from Amjad Karie Mainland PA-C upon signout (see his note).  Patient presented to ED for concern of headache following head trauma.  He is following up on a stroke in your head.  No LOC noted but has been nauseous and "off balance" since. CT head negative for ICH, intracranial pathology. Neurologically intact.  I spoke with patient prior to discharge. She complains of headache on top of her head where ice bag impacted with her head. She continues to be neurologically intact without visual disturbances. All questions answered to her satisfaction. Patient's daughter is driving her home and will be with her to monitor her today.  Patient is discharged with strict return precautions. Concussion clinic recommendation and information provided with d/c paperwork.   Judithann Sheen, PA 11/14/22 1539    Charlynne Pander, MD 11/14/22 [phone]

## 2022-11-14 NOTE — ED Provider Notes (Signed)
[place] EMERGENCY DEPARTMENT AT [facility] Provider Note   CSN: [id number] Arrival date & time: 11/14/22  1216     History  Chief Complaint  Patient presents with   Head Injury    Jennesa Bielefeld is a 52 y.o. female.  52 year old female presents today for concern of headache following head trauma.  She was at work when a box of ice fell off of a shelf.  She states she did not pass out.  But since the head injury she has been nauseous, and has been off balance somewhat.  Not on anticoagulation.  The history is provided by the patient. No language interpreter was used.       Home Medications Prior to Admission medications   Medication Sig Start Date End Date Taking? Authorizing Provider  atorvastatin (LIPITOR) 20 MG tablet Take 1 tablet (20 mg total) by mouth daily. 02/15/22 02/10/23  Patwardhan, Anabel Bene, MD  lisinopril (ZESTRIL) 10 MG tablet Take 1 tablet (10 mg total) by mouth daily. 03/29/22   Patwardhan, Anabel Bene, MD  nitroGLYCERIN (NITROSTAT) 0.4 MG SL tablet Place 1 tablet (0.4 mg total) under the tongue every 5 (five) minutes as needed for chest pain. 02/15/22 05/16/22  Patwardhan, Anabel Bene, MD      Allergies    Patient has no known allergies.    Review of Systems   Review of Systems  Constitutional:  Negative for chills and fever.  Gastrointestinal:  Positive for abdominal pain. Negative for nausea and vomiting.  Genitourinary:  Negative for dysuria.  Neurological:  Negative for light-headedness.  All other systems reviewed and are negative.   Physical Exam Updated Vital Signs BP 126/68   Pulse 68   Temp 98.6 F (37 C) (Oral)   Resp 16   Ht 4\' 9"  (1.448 m)   Wt 65.8 kg   SpO2 98%   BMI 31.38 kg/m  Physical Exam Vitals and nursing note reviewed.  Constitutional:      General: She is not in acute distress.    Appearance: Normal appearance. She is not ill-appearing.  HENT:     Head: Normocephalic and atraumatic.     Nose: Nose normal.  Eyes:      General: No scleral icterus.    Extraocular Movements: Extraocular movements intact.     Conjunctiva/sclera: Conjunctivae normal.     Pupils: Pupils are equal, round, and reactive to light.  Cardiovascular:     Rate and Rhythm: Normal rate and regular rhythm.     Pulses: Normal pulses.     Heart sounds: Normal heart sounds.  Pulmonary:     Effort: Pulmonary effort is normal. No respiratory distress.     Breath sounds: Normal breath sounds. No wheezing or rales.  Abdominal:     General: There is no distension.     Palpations: Abdomen is soft.     Tenderness: There is no abdominal tenderness. There is no guarding.     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  Colostomy bag has some output.  Musculoskeletal:        General: Normal range of motion.     Cervical back: Normal range of motion.  Skin:    General: Skin is warm and dry.  Neurological:     General: No focal deficit present.     Mental Status: She is alert and oriented to person, place, and time. Mental status is at baseline.     Cranial Nerves: No cranial nerve deficit.  Comments: Full range of motion of bilateral upper and lower extremities with 5/5 strength.  Cranial nerves III through XII intact.     ED Results / Procedures / Treatments   Labs (all labs ordered are listed, but only abnormal results are displayed) Labs Reviewed - No data to display  EKG None  Radiology No results found.  Procedures Procedures    Medications Ordered in ED Medications  ondansetron (ZOFRAN-ODT) disintegrating tablet 8 mg (8 mg Oral Given 11/14/22 1317)  ketorolac (TORADOL) 15 MG/ML injection 15 mg (15 mg Intramuscular Given 11/14/22 1317)    ED Course/ Medical Decision Making/ A&P                                 Medical Decision Making Amount and/or Complexity of Data Reviewed Radiology: ordered.  Risk Prescription drug management.   Medical Decision Making / ED Course   This patient presents to the ED  for concern of headache, this involves an extensive number of treatment options, and is a complaint that carries with it a high risk of complications and morbidity.  The differential diagnosis includes concussion, intracranial bleed, posttraumatic headache  MDM: 52 year old female presents today for concern of head injury.  Neurological exam without any focal deficits.  Will provide Zofran, and obtain CT head.    Lab Tests: -I ordered, reviewed, and interpreted labs.   The pertinent results include:   Labs Reviewed - No data to display    EKG  EKG Interpretation Date/Time:    Ventricular Rate:    PR Interval:    QRS Duration:    QT Interval:    QTC Calculation:   R Axis:      Text Interpretation:           Imaging Studies ordered: I ordered imaging studies including CT head.  Did not result prior to end of my shift. I independently visualized and interpreted imaging. I agree with the radiologist interpretation   Medicines ordered and prescription drug management: Meds ordered this encounter  Medications   ondansetron (ZOFRAN-ODT) disintegrating tablet 8 mg   ketorolac (TORADOL) 15 MG/ML injection 15 mg    -I have reviewed the patients home medicines and have made adjustments as needed   Reevaluation: After the interventions noted above, I reevaluated the patient and found that they have :stayed the same  Co morbidities that complicate the patient evaluation  Past Medical History:  Diagnosis Date   Allergy    seasonal   Anxiety    Hyperlipidemia    Not currently being tx.       Dispostion: Signout to oncoming provider to follow-up on CT imaging.  Final Clinical Impression(s) / ED Diagnoses Final diagnoses:  Injury of head, initial encounter    Rx / DC Orders ED Discharge Orders     None         Marita Kansas, PA-C 11/14/22 1500    Alvira Monday, MD 11/14/22 2228

## 2022-11-14 NOTE — Discharge Instructions (Addendum)
CT scan did not show any concerns today.  Likely have a concussion.  It is normal to have headache that is not severe, occasional nausea and vomiting.  However if your headache becomes severe, you are constantly vomiting and not able to keep anything down, have any vision change please return for evaluation.  If your symptoms last longer than a week follow-up with the concussion clinic as listed above.

## 2022-11-14 NOTE — ED Triage Notes (Signed)
Patient presented from work where 7lb bag of ice fell on top of head at work. Patient endorses nausea, family with patient states she seems a little confused and was little wobbly when walking. Patient denies loss of consciousness and denies taking blood thinners. Moves all extremities equally. A&Ox4 in triage.

## 2024-03-20 ENCOUNTER — Ambulatory Visit: Payer: Self-pay
